# Patient Record
Sex: Male | Born: 1945
Health system: Southern US, Community
[De-identification: ages and names within clinical notes are randomized; demographics above are authoritative.]

## PROBLEM LIST (undated history)

## (undated) DIAGNOSIS — IMO0001 Reserved for inherently not codable concepts without codable children: Secondary | ICD-10-CM

## (undated) DIAGNOSIS — H5440 Blindness, one eye, unspecified eye: Secondary | ICD-10-CM

## (undated) DIAGNOSIS — R350 Frequency of micturition: Secondary | ICD-10-CM

## (undated) DIAGNOSIS — R739 Hyperglycemia, unspecified: Secondary | ICD-10-CM

## (undated) DIAGNOSIS — I200 Unstable angina: Secondary | ICD-10-CM

## (undated) DIAGNOSIS — Z973 Presence of spectacles and contact lenses: Secondary | ICD-10-CM

## (undated) DIAGNOSIS — M1990 Unspecified osteoarthritis, unspecified site: Secondary | ICD-10-CM

## (undated) DIAGNOSIS — N179 Acute kidney failure, unspecified: Secondary | ICD-10-CM

## (undated) DIAGNOSIS — I00 Rheumatic fever without heart involvement: Secondary | ICD-10-CM

## (undated) DIAGNOSIS — Z951 Presence of aortocoronary bypass graft: Secondary | ICD-10-CM

## (undated) DIAGNOSIS — S0292XA Unspecified fracture of facial bones, initial encounter for closed fracture: Secondary | ICD-10-CM

## (undated) DIAGNOSIS — I219 Acute myocardial infarction, unspecified: Secondary | ICD-10-CM

## (undated) DIAGNOSIS — M8618 Other acute osteomyelitis, other site: Secondary | ICD-10-CM

## (undated) DIAGNOSIS — J449 Chronic obstructive pulmonary disease, unspecified: Secondary | ICD-10-CM

## (undated) HISTORY — DX: Other acute osteomyelitis, other site: M86.18

## (undated) HISTORY — DX: Chronic obstructive pulmonary disease, unspecified: J44.9

## (undated) HISTORY — DX: Acute kidney failure, unspecified: N17.9

## (undated) HISTORY — DX: Hyperglycemia, unspecified: R73.9

## (undated) HISTORY — DX: Unspecified fracture of facial bones, initial encounter for closed fracture: S02.92XA

## (undated) HISTORY — DX: Presence of aortocoronary bypass graft: Z95.1

## (undated) HISTORY — PX: TONSILLECTOMY: SUR1361

## (undated) HISTORY — PX: ENUCLEATION: SHX628

---

## 2002-04-21 DIAGNOSIS — S0292XA Unspecified fracture of facial bones, initial encounter for closed fracture: Secondary | ICD-10-CM

## 2002-04-21 HISTORY — DX: Unspecified fracture of facial bones, initial encounter for closed fracture: S02.92XA

## 2002-04-21 HISTORY — PX: TRACHEOSTOMY: SUR1362

## 2003-04-14 ENCOUNTER — Inpatient Hospital Stay (HOSPITAL_COMMUNITY): Admission: AC | Admit: 2003-04-14 | Discharge: 2003-04-24 | Payer: Self-pay

## 2003-04-17 ENCOUNTER — Encounter: Payer: Self-pay | Admitting: Cardiology

## 2008-07-22 ENCOUNTER — Ambulatory Visit: Payer: Self-pay | Admitting: Radiology

## 2008-07-22 ENCOUNTER — Emergency Department (HOSPITAL_COMMUNITY): Admission: EM | Admit: 2008-07-22 | Discharge: 2008-07-22 | Payer: Self-pay | Admitting: Emergency Medicine

## 2008-07-22 ENCOUNTER — Encounter: Payer: Self-pay | Admitting: Emergency Medicine

## 2008-08-21 ENCOUNTER — Inpatient Hospital Stay (HOSPITAL_COMMUNITY): Admission: EM | Admit: 2008-08-21 | Discharge: 2008-08-22 | Payer: Self-pay | Admitting: Emergency Medicine

## 2008-08-21 ENCOUNTER — Encounter (INDEPENDENT_AMBULATORY_CARE_PROVIDER_SITE_OTHER): Payer: Self-pay | Admitting: Surgery

## 2010-04-21 HISTORY — PX: CHOLECYSTECTOMY: SHX55

## 2010-07-30 LAB — COMPREHENSIVE METABOLIC PANEL
ALT: 27 U/L (ref 0–53)
AST: 28 U/L (ref 0–37)
Albumin: 4.1 g/dL (ref 3.5–5.2)
Alkaline Phosphatase: 85 U/L (ref 39–117)
BUN: 14 mg/dL (ref 6–23)
CO2: 25 mEq/L (ref 19–32)
Calcium: 9.4 mg/dL (ref 8.4–10.5)
Chloride: 104 mEq/L (ref 96–112)
Creatinine, Ser: 0.8 mg/dL (ref 0.4–1.5)
GFR calc Af Amer: >60 mL/min (ref >60)
GFR calc non Af Amer: >60 mL/min (ref >60)
Glucose, Bld: 141 mg/dL — ABNORMAL HIGH (ref 70–99)
Potassium: 4.1 mEq/L (ref 3.5–5.1)
Sodium: 136 mEq/L (ref 135–145)
Total Bilirubin: 0.8 mg/dL (ref 0.3–1.2)
Total Protein: 6.6 g/dL (ref 6.0–8.3)

## 2010-07-30 LAB — DIFFERENTIAL
Basophils Absolute: 0 10*3/uL (ref 0.0–0.1)
Basophils Relative: 0 % (ref 0–1)
Eosinophils Absolute: 0 10*3/uL (ref 0.0–0.7)
Eosinophils Relative: 0 % (ref 0–5)
Lymphocytes Relative: 7 % — ABNORMAL LOW (ref 12–46)
Lymphs Abs: 0.8 10*3/uL (ref 0.7–4.0)
Monocytes Absolute: 0.4 10*3/uL (ref 0.1–1.0)
Monocytes Relative: 3 % (ref 3–12)
Neutro Abs: 10.5 10*3/uL — ABNORMAL HIGH (ref 1.7–7.7)
Neutrophils Relative %: 90 % — ABNORMAL HIGH (ref 43–77)

## 2010-07-30 LAB — LIPASE, BLOOD: Lipase: 18 U/L (ref 11–59)

## 2010-07-30 LAB — CBC
HCT: 53.8 % — ABNORMAL HIGH (ref 39.0–52.0)
Hemoglobin: 18.5 g/dL — ABNORMAL HIGH (ref 13.0–17.0)
MCHC: 34.4 g/dL (ref 30.0–36.0)
MCV: 91.8 fL (ref 78.0–100.0)
Platelets: 152 10*3/uL (ref 150–400)
RBC: 5.86 MIL/uL — ABNORMAL HIGH (ref 4.22–5.81)
RDW: 13.5 % (ref 11.5–15.5)
WBC: 11.7 10*3/uL — ABNORMAL HIGH (ref 4.0–10.5)

## 2010-07-31 LAB — DIFFERENTIAL
Lymphocytes Relative: 10 % — ABNORMAL LOW (ref 12–46)
Lymphs Abs: 1.2 10*3/uL (ref 0.7–4.0)
Monocytes Relative: 7 % (ref 3–12)
Neutro Abs: 9.8 10*3/uL — ABNORMAL HIGH (ref 1.7–7.7)
Neutrophils Relative %: 79 % — ABNORMAL HIGH (ref 43–77)

## 2010-07-31 LAB — COMPREHENSIVE METABOLIC PANEL
BUN: 10 mg/dL (ref 6–23)
Calcium: 9.3 mg/dL (ref 8.4–10.5)
Glucose, Bld: 145 mg/dL — ABNORMAL HIGH (ref 70–99)
Sodium: 141 mEq/L (ref 135–145)
Total Protein: 7.9 g/dL (ref 6.0–8.3)

## 2010-07-31 LAB — CBC
HCT: 51.7 % (ref 39.0–52.0)
Hemoglobin: 17.4 g/dL — ABNORMAL HIGH (ref 13.0–17.0)
MCHC: 33.6 g/dL (ref 30.0–36.0)
RDW: 12.4 % (ref 11.5–15.5)

## 2010-09-03 NOTE — Op Note (Signed)
Sean Bridges, Sean Bridges NO.:  [id number]   MEDICAL RECORD NO.:  [id number]          PATIENT TYPE:  INP   LOCATION:  0098                         FACILITY:  [facility]   PHYSICIAN:  Velora Heckler, MD      DATE OF BIRTH:  01-23-1946   DATE OF PROCEDURE:  08/21/2008  DATE OF DISCHARGE:                               OPERATIVE REPORT   PREOPERATIVE DIAGNOSIS:  Acute cholecystitis.   POSTOPERATIVE DIAGNOSIS:  Acute cholecystitis.   PROCEDURE:  Laparoscopic cholecystectomy.   SURGEON:  Velora Heckler, M.D., FACS   ASSISTANT:  Cyndia Bent, M.D., FACS   ANESTHESIA:  General per Dr. Clydell Hakim.   ESTIMATED BLOOD LOSS:  Minimal.   PREPARATION:  Betadine.   COMPLICATIONS:  None.   INDICATIONS:  The patient is a 65 year old white male who presents to  the emergency department with 12-hour history of right upper quadrant  abdominal pain associated with nausea and vomiting.  The patient had a  similar episode 1 month prior which brought him to the emergency  department.  Laboratory studies showed a white count of 11.7 with 90%  segmented neutrophils.  Ultrasound demonstrated a thick-walled  gallbladder with pericholecystic fluid and a sonographic Murphy sign,  all consistent with acute cholecystitis.  The patient is now brought to  the operating room for cholecystectomy.   BODY OF REPORT:  The procedure is done in OR #6 at the [facility].  The patient is brought to the operating room and  placed in the supine position on the operating room table.  Following  administration of general anesthesia, the patient is positioned and then  prepped and draped in the usual strict aseptic fashion.  After  ascertaining that an adequate level of anesthesia had been achieved, an  infraumbilical incision is made with a #15 blade.  Dissection is carried  down to the fascia.  The fascia is incised in the midline and the  peritoneal cavity is entered cautiously.   A 0-Vicryl pursestring suture  is placed in the fascia.  An Hasson cannula is introduced and secured  with a pursestring suture.  The abdomen is insufflated with carbon  dioxide. The laparoscope is introduced and the abdomen explored.  Operative ports are placed along the right costal margin in the midline,  midclavicular line and anterior axillary line.  Omentum is stripped off  of the surface of the gallbladder.  The gallbladder is markedly  distended, acutely inflamed and somewhat ischemic looking.  Using an  aspirating trocar, the gallbladder is evacuated of dark black bile.  The  gallbladder is then grasped and retracted cephalad and the omental  adhesions are taken down.  Dissection is carried down to the neck of the  gallbladder where there is a large lymph node present.  This is  carefully dissected away.  The cystic duct is identified and dissected  out.  It is relatively foreshortened.  Tissues are very inflamed and  friable.  Decision is made not to proceed with cholangiography and the  cystic duct is triply clipped and divided.  The cystic artery is  dissected out, doubly clipped, and divided.  The posterior branch of the  cystic artery is doubly clipped and divided.  The gallbladder is then  excised from the gallbladder bed using the hook electrocautery for  hemostasis.  Due to inflammation, it is difficult at some points to  determine the plane between the gallbladder and the liver.  However, the  gallbladder is successfully excised and hemostasis is obtained with the  electrocautery.  The gallbladder is placed into an EndoCatch bag and  withdrawn through the umbilical port with some difficulty.  The  gallbladder wall is markedly thickened.  There are no palpable stones  and no visible stones.  The right upper quadrant is copiously irrigated  with warm saline and good hemostasis is obtained in the gallbladder bed  with electrocautery.  Surgicel is placed in the gallbladder  bed.  Fluid  is evacuated.  Pneumoperitoneum is released and ports are removed under  direct vision.  Good hemostasis is noted at all port sites.  Pneumoperitoneum is completely released.  Zero Vicryl pursestring  sutures are tied securely.  Port sites are anesthetized with local  anesthetic.  Wounds are closed with interrupted 4-0 Vicryl subcuticular  sutures.  Wounds are washed and dried and Benzoin and Steri-Strips are  applied.  Sterile dressings are applied.  The patient is awakened from  anesthesia and brought to the recovery room.  The patient tolerated the  procedure well.      Velora Heckler, MD  Electronically Signed     TMG/MEDQ  D:  08/21/2008  T:  08/21/2008  Job:  [id number]   cc:   Stacie Acres. Cliffton Asters, M.D.  Fax: [phone]

## 2010-09-03 NOTE — Consult Note (Signed)
NAME:  Sean Bridges, Sean Bridges NO.:  [id number]   MEDICAL RECORD NO.:  [id number]          PATIENT TYPE:  EMS   LOCATION:  ED                           FACILITY:  [facility]   PHYSICIAN:  Thornton Park. Daphine Deutscher, MD  DATE OF BIRTH:  14-Mar-1946   DATE OF CONSULTATION:  07/22/2008  DATE OF DISCHARGE:  07/22/2008                                 CONSULTATION   CHIEF COMPLAINT:  Cholecystitis, 2 days' duration.   HISTORY:  This is a 66 year old male who was seen at the [facility] and apparently sent here with a chief complaint of abdominal pain  over the last 2 days.  He actually had some pain here about 2 weeks ago  but these symptoms resolved.  The patient said he had some peripherally  persistent right upper quadrant pain although he said it recently eased  off.   I was called in to see the patient about surgical management and, when I  approached him, he said that he wanted know about nonsurgical management  and was interested in that at this time.  I went ahead and let him have  antibiotics and something for pain and to stay on a low-fat diet.  If  this then resolved, then he could see [place] in the office.   PAST MEDICAL HISTORY:  He has had no prior abdominal surgery.   No drug abuse.  He is a smoker.  Occasional drinker.   He has no known drug allergies.   He takes no medications.   The patient had laboratory showing a hemoglobin of 17.4, white count  12,500.  Lipase was normal.  BUN 24 is within normal limits totally.  Gallbladder ultrasound was consistent with cholecystitis.   After discussing options with him, the patient wants to be treated  medically.  We will send him home on some Augmentin 500 mg b.i.d. and  some Vicodin for pain.  He will follow up in the office if needed.      Thornton Park Daphine Deutscher, MD  Electronically Signed     MBM/MEDQ  D:  07/22/2008  T:  07/22/2008  Job:  [id number]
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
## 2013-06-22 ENCOUNTER — Other Ambulatory Visit: Payer: Self-pay | Admitting: Internal Medicine

## 2013-06-22 DIAGNOSIS — R319 Hematuria, unspecified: Secondary | ICD-10-CM

## 2013-06-24 ENCOUNTER — Ambulatory Visit
Admission: RE | Admit: 2013-06-24 | Discharge: 2013-06-24 | Disposition: A | Discharge status: Home / Self Care | Payer: Medicare Other | Source: Ambulatory Visit | Attending: Internal Medicine | Admitting: Internal Medicine

## 2013-06-24 DIAGNOSIS — R319 Hematuria, unspecified: Secondary | ICD-10-CM

## 2013-08-16 ENCOUNTER — Other Ambulatory Visit: Payer: Self-pay | Admitting: Urology

## 2013-10-06 ENCOUNTER — Encounter (HOSPITAL_BASED_OUTPATIENT_CLINIC_OR_DEPARTMENT_OTHER): Payer: Self-pay | Admitting: *Deleted

## 2013-10-06 NOTE — Progress Notes (Signed)
To [facility] at 0700-Hg on arrival-instructed NPO after Mn- also to refrain from smoking.

## 2013-10-12 ENCOUNTER — Ambulatory Visit (HOSPITAL_BASED_OUTPATIENT_CLINIC_OR_DEPARTMENT_OTHER): Payer: Medicare Other | Admitting: Anesthesiology

## 2013-10-12 ENCOUNTER — Encounter (HOSPITAL_BASED_OUTPATIENT_CLINIC_OR_DEPARTMENT_OTHER): Payer: Self-pay | Admitting: *Deleted

## 2013-10-12 ENCOUNTER — Encounter (HOSPITAL_BASED_OUTPATIENT_CLINIC_OR_DEPARTMENT_OTHER): Payer: Medicare Other | Admitting: Anesthesiology

## 2013-10-12 ENCOUNTER — Ambulatory Visit (HOSPITAL_BASED_OUTPATIENT_CLINIC_OR_DEPARTMENT_OTHER)
Admission: RE | Admit: 2013-10-12 | Discharge: 2013-10-12 | Disposition: A | Discharge status: Home / Self Care | Payer: Medicare Other | Source: Ambulatory Visit | Attending: Urology | Admitting: Urology

## 2013-10-12 ENCOUNTER — Encounter (HOSPITAL_BASED_OUTPATIENT_CLINIC_OR_DEPARTMENT_OTHER)
Admission: RE | Disposition: A | Discharge status: Home / Self Care | Payer: Self-pay | Source: Ambulatory Visit | Attending: Urology

## 2013-10-12 DIAGNOSIS — R3129 Other microscopic hematuria: Secondary | ICD-10-CM | POA: Insufficient documentation

## 2013-10-12 DIAGNOSIS — F17200 Nicotine dependence, unspecified, uncomplicated: Secondary | ICD-10-CM | POA: Insufficient documentation

## 2013-10-12 DIAGNOSIS — N478 Other disorders of prepuce: Secondary | ICD-10-CM | POA: Insufficient documentation

## 2013-10-12 DIAGNOSIS — N400 Enlarged prostate without lower urinary tract symptoms: Secondary | ICD-10-CM | POA: Insufficient documentation

## 2013-10-12 DIAGNOSIS — N471 Phimosis: Secondary | ICD-10-CM | POA: Insufficient documentation

## 2013-10-12 HISTORY — PX: CIRCUMCISION: SHX1350

## 2013-10-12 HISTORY — PX: CYSTOSCOPY: SHX5120

## 2013-10-12 HISTORY — DX: Rheumatic fever without heart involvement: I00

## 2013-10-12 LAB — POCT I-STAT, CHEM 8
BUN: 16 mg/dL (ref 6–23)
CALCIUM ION: 1.27 mmol/L (ref 1.13–1.30)
CHLORIDE: 103 meq/L (ref 96–112)
CREATININE: 0.9 mg/dL (ref 0.50–1.35)
GLUCOSE: 119 mg/dL — AB (ref 70–99)
HEMATOCRIT: 57 % — AB (ref 39.0–52.0)
Hemoglobin: 19.4 g/dL — ABNORMAL HIGH (ref 13.0–17.0)
Potassium: 4 mEq/L (ref 3.7–5.3)
Sodium: 143 mEq/L (ref 137–147)
TCO2: 24 mmol/L (ref 0–100)

## 2013-10-12 SURGERY — CIRCUMCISION, ADULT
Anesthesia: General | Site: Ureter

## 2013-10-12 MED ORDER — LACTATED RINGERS IV SOLN
INTRAVENOUS | Status: DC
  Administered 2013-10-12: 08:00:00 via INTRAVENOUS
  Filled 2013-10-12: qty 1000

## 2013-10-12 MED ORDER — PROPOFOL 10 MG/ML IV BOLUS
INTRAVENOUS | Status: DC | PRN
  Administered 2013-10-12: 180 mg via INTRAVENOUS

## 2013-10-12 MED ORDER — FENTANYL CITRATE 0.05 MG/ML IJ SOLN
INTRAMUSCULAR | Status: DC | PRN
  Administered 2013-10-12 (×2): 25 ug via INTRAVENOUS
  Administered 2013-10-12: 50 ug via INTRAVENOUS
  Administered 2013-10-12: 25 ug via INTRAVENOUS

## 2013-10-12 MED ORDER — DEXAMETHASONE SODIUM PHOSPHATE 4 MG/ML IJ SOLN
INTRAMUSCULAR | Status: DC | PRN
  Administered 2013-10-12: 10 mg via INTRAVENOUS

## 2013-10-12 MED ORDER — FENTANYL CITRATE 0.05 MG/ML IJ SOLN
INTRAMUSCULAR | Status: AC
  Filled 2013-10-12: qty 6

## 2013-10-12 MED ORDER — CLINDAMYCIN PHOSPHATE 900 MG/50ML IV SOLN
INTRAVENOUS | Status: AC
  Filled 2013-10-12: qty 50

## 2013-10-12 MED ORDER — OXYCODONE-ACETAMINOPHEN 5-325 MG PO TABS: 1.0000 | ORAL_TABLET | Freq: Four times a day (QID) | ORAL | Status: DC | PRN

## 2013-10-12 MED ORDER — CLINDAMYCIN PHOSPHATE 900 MG/50ML IV SOLN
900.0000 mg | Freq: Once | INTRAVENOUS | Status: AC
  Administered 2013-10-12: 900 mg via INTRAVENOUS
  Filled 2013-10-12: qty 50

## 2013-10-12 MED ORDER — MIDAZOLAM HCL 5 MG/5ML IJ SOLN
INTRAMUSCULAR | Status: DC | PRN
  Administered 2013-10-12: 2 mg via INTRAVENOUS

## 2013-10-12 MED ORDER — GENTAMICIN IN SALINE 1.6-0.9 MG/ML-% IV SOLN
80.0000 mg | INTRAVENOUS | Status: AC
  Administered 2013-10-12: 390 mg via INTRAVENOUS
  Filled 2013-10-12: qty 50

## 2013-10-12 MED ORDER — STERILE WATER FOR IRRIGATION IR SOLN
Status: DC | PRN
  Administered 2013-10-12: 3000 mL

## 2013-10-12 MED ORDER — EPHEDRINE SULFATE 50 MG/ML IJ SOLN
INTRAMUSCULAR | Status: DC | PRN
  Administered 2013-10-12: 10 mg via INTRAVENOUS
  Administered 2013-10-12: 5 mg via INTRAVENOUS

## 2013-10-12 MED ORDER — LIDOCAINE HCL (CARDIAC) 20 MG/ML IV SOLN
INTRAVENOUS | Status: DC | PRN
Start: 2013-10-12 — End: 2013-10-12
  Administered 2013-10-12: 60 mg via INTRAVENOUS

## 2013-10-12 MED ORDER — ONDANSETRON HCL 4 MG/2ML IJ SOLN
INTRAMUSCULAR | Status: DC | PRN
  Administered 2013-10-12: 4 mg via INTRAVENOUS

## 2013-10-12 MED ORDER — MIDAZOLAM HCL 2 MG/2ML IJ SOLN
INTRAMUSCULAR | Status: AC
  Filled 2013-10-12: qty 2

## 2013-10-12 MED ORDER — SENNOSIDES-DOCUSATE SODIUM 8.6-50 MG PO TABS: 1.0000 | ORAL_TABLET | Freq: Two times a day (BID) | ORAL | Status: DC

## 2013-10-12 MED ORDER — FENTANYL CITRATE 0.05 MG/ML IJ SOLN
25.0000 ug | INTRAMUSCULAR | Status: DC | PRN
  Filled 2013-10-12: qty 1

## 2013-10-12 MED ORDER — PROMETHAZINE HCL 25 MG/ML IJ SOLN
6.2500 mg | INTRAMUSCULAR | Status: DC | PRN
  Filled 2013-10-12: qty 1

## 2013-10-12 MED ORDER — MEPERIDINE HCL 25 MG/ML IJ SOLN
6.2500 mg | INTRAMUSCULAR | Status: DC | PRN
  Filled 2013-10-12: qty 1

## 2013-10-12 SURGICAL SUPPLY — 45 items
ADH SKN CLS APL DERMABOND .7 (GAUZE/BANDAGES/DRESSINGS) ×2
BAG URO CATCHER STRL LF (DRAPE) ×3 IMPLANT
BANDAGE COBAN STERILE 2 (GAUZE/BANDAGES/DRESSINGS) IMPLANT
BLADE SURG 15 STRL LF DISP TIS (BLADE) ×2 IMPLANT
BLADE SURG 15 STRL SS (BLADE) ×2
BNDG CONFORM 2 STRL LF (GAUZE/BANDAGES/DRESSINGS) IMPLANT
CATH ROBINSON RED A/P 14FR (CATHETERS) ×3 IMPLANT
CLOTH BEACON ORANGE TIMEOUT ST (SAFETY) ×3 IMPLANT
COVER LIGHT HANDLE  1/PK (MISCELLANEOUS) ×1
COVER LIGHT HANDLE 1/PK (MISCELLANEOUS) ×2 IMPLANT
COVER MAYO STAND STRL (DRAPES) IMPLANT
COVER TABLE BACK 60X90 (DRAPES) ×3 IMPLANT
DECANTER SPIKE VIAL GLASS SM (MISCELLANEOUS) IMPLANT
DERMABOND ADVANCED (GAUZE/BANDAGES/DRESSINGS) ×1
DERMABOND ADVANCED .7 DNX12 (GAUZE/BANDAGES/DRESSINGS) ×2 IMPLANT
DRAPE CAMERA CLOSED 9X96 (DRAPES) ×3 IMPLANT
DRAPE LAPAROTOMY T 102X78X121 (DRAPES) IMPLANT
DRAPE PED LAPAROTOMY (DRAPES) IMPLANT
ELECT NEEDLE TIP 2.8 STRL (NEEDLE) IMPLANT
ELECT REM PT RETURN 9FT ADLT (ELECTROSURGICAL) ×3
ELECTRODE REM PT RTRN 9FT ADLT (ELECTROSURGICAL) ×2 IMPLANT
GAUZE SPONGE 4X4 16PLY XRAY LF (GAUZE/BANDAGES/DRESSINGS) ×3 IMPLANT
GAUZE XEROFORM 1X8 LF (GAUZE/BANDAGES/DRESSINGS) IMPLANT
GLOVE BIO SURGEON STRL SZ 6.5 (GLOVE) ×3 IMPLANT
GLOVE BIO SURGEON STRL SZ7.5 (GLOVE) ×3 IMPLANT
GLOVE BIOGEL PI IND STRL 6.5 (GLOVE) ×2 IMPLANT
GLOVE BIOGEL PI INDICATOR 6.5 (GLOVE) ×1
GOWN PREVENTION PLUS LG XLONG (DISPOSABLE) IMPLANT
GOWN STRL NON-REIN LRG LVL3 (GOWN DISPOSABLE) IMPLANT
GOWN STRL REIN XL XLG (GOWN DISPOSABLE) IMPLANT
GOWN STRL REUS W/TWL LRG LVL3 (GOWN DISPOSABLE) ×3 IMPLANT
GOWN STRL REUS W/TWL XL LVL3 (GOWN DISPOSABLE) ×3 IMPLANT
NEEDLE HYPO 25X1 1.5 SAFETY (NEEDLE) IMPLANT
NS IRRIG 500ML POUR BTL (IV SOLUTION) IMPLANT
PACK BASIN DAY SURGERY FS (CUSTOM PROCEDURE TRAY) IMPLANT
PACK CYSTOSCOPY (CUSTOM PROCEDURE TRAY) ×3 IMPLANT
PENCIL BUTTON HOLSTER BLD 10FT (ELECTRODE) ×3 IMPLANT
SPONGE GAUZE 4X4 12PLY (GAUZE/BANDAGES/DRESSINGS) ×3 IMPLANT
SUT VIC AB 3-0 SH 27 (SUTURE) ×2
SUT VIC AB 3-0 SH 27X BRD (SUTURE) ×2 IMPLANT
SYR CONTROL 10ML LL (SYRINGE) IMPLANT
TOWEL OR 17X26 10 PK STRL BLUE (TOWEL DISPOSABLE) ×3 IMPLANT
TRAY DSU PREP LF (CUSTOM PROCEDURE TRAY) ×3 IMPLANT
WATER STERILE IRR 3000ML UROMA (IV SOLUTION) ×3 IMPLANT
WATER STERILE IRR 500ML POUR (IV SOLUTION) IMPLANT

## 2013-10-12 NOTE — Anesthesia Postprocedure Evaluation (Signed)
  Anesthesia Post-op Note  Patient: Sean Bridges  Procedure(s) Performed: Procedure(s) (LRB): DORSAL SLIT (N/A) CYSTOSCOPY (N/A)  Patient Location: PACU  Anesthesia Type: General  Level of Consciousness: awake and alert   Airway and Oxygen Therapy: Patient Spontanous Breathing  Post-op Pain: mild  Post-op Assessment: Post-op Vital signs reviewed, Patient's Cardiovascular Status Stable, Respiratory Function Stable, Patent Airway and No signs of Nausea or vomiting  Last Vitals:  Filed Vitals:   10/12/13 0930  BP: 125/79  Pulse: 77  Temp:   Resp: 13    Post-op Vital Signs: stable   Complications: No apparent anesthesia complications

## 2013-10-12 NOTE — Anesthesia Procedure Notes (Signed)
Procedure Name: LMA Insertion Date/Time: 10/12/2013 8:28 AM Performed by: Denna Haggard D Pre-anesthesia Checklist: Patient identified, Emergency Drugs available, Suction available and Patient being monitored Patient Re-evaluated:Patient Re-evaluated prior to inductionOxygen Delivery Method: Circle System Utilized Preoxygenation: Pre-oxygenation with 100% oxygen Intubation Type: IV induction Ventilation: Mask ventilation without difficulty LMA: LMA inserted LMA Size: 4.0 Number of attempts: 1 Airway Equipment and Method: bite block Placement Confirmation: positive ETCO2 Tube secured with: Tape Dental Injury: Teeth and Oropharynx as per pre-operative assessment

## 2013-10-12 NOTE — Anesthesia Preprocedure Evaluation (Addendum)
Anesthesia Evaluation  Patient identified by MRN, date of birth, ID band Patient awake    Reviewed: Allergy & Precautions, H&P , NPO status , Patient's Chart, lab work & pertinent test results  Airway Mallampati: II TM Distance: >3 FB Neck ROM: Full    Dental no notable dental hx.    Pulmonary Current Smoker,  breath sounds clear to auscultation  Pulmonary exam normal       Cardiovascular negative cardio ROS  Rhythm:Regular Rate:Normal     Neuro/Psych negative neurological ROS  negative psych ROS   GI/Hepatic negative GI ROS, Neg liver ROS,   Endo/Other  negative endocrine ROS  Renal/GU negative Renal ROS  negative genitourinary   Musculoskeletal negative musculoskeletal ROS (+)   Abdominal   Peds negative pediatric ROS (+)  Hematology negative hematology ROS (+)   Anesthesia Other Findings   Reproductive/Obstetrics negative OB ROS                          Anesthesia Physical Anesthesia Plan  ASA: II  Anesthesia Plan: General   Post-op Pain Management:    Induction: Intravenous  Airway Management Planned: LMA  Additional Equipment:   Intra-op Plan:   Post-operative Plan:   Informed Consent: I have reviewed the patients History and Physical, chart, labs and discussed the procedure including the risks, benefits and alternatives for the proposed anesthesia with the patient or authorized representative who has indicated his/her understanding and acceptance.   Dental advisory given  Plan Discussed with: CRNA  Anesthesia Plan Comments:         Anesthesia Quick Evaluation

## 2013-10-12 NOTE — Op Note (Signed)
NAME:  Sean Bridges, Sean Bridges NO.:  [id number]  MEDICAL RECORD NO.:  [id number]  LOCATION:                                 FACILITY:  PHYSICIAN:  Alexis Frock, MD     DATE OF BIRTH:  01-Aug-1945  DATE OF PROCEDURE:  10/12/2013 DATE OF DISCHARGE:                              OPERATIVE REPORT   PREOPERATIVE DIAGNOSES:  Phimosis and microscopic hematuria.  PROCEDURE: 1. Foreskin dorsal slit. 2. Cystoscopy.  ESTIMATED BLOOD LOSS:  Nil.  COMPLICATIONS:  None.  SPECIMEN:  None.  FINDINGS: 1. Dense distal phimotic ring, this was incised approximately 2 cm     such that the urethral meatus and glans could be visualized but no     further as per patient's wishes. 2. Large bilobar prostatic hypertrophy with some prostatic varicosity. 3. Otherwise, unremarkable urinary bladder.  INDICATION:  Sean Bridges is a pleasant 68 year old gentleman with recent history of microhematuria.  He underwent evaluation with CT urogram, which was unremarkable.  He underwent trial of office cystoscopy; however, this was unsuccessful ashe had incredibly dense phimosis of his redundant foreskin which precluded identificaiton of his urethral meatus.  Options were discussed including operative dorsal slit versus circumcision and cystoscopy to complete the evaluation and he wished to proceed with dorsal slit and cysto.  Informed consent was obtained and placed in medical record.  PROCEDURE IN DETAIL:  The patient being Sean Bridges was verified, procedure being cystoscopy and dorsal slit was confirmed.    Time-out was performed.  Intravenous antibiotics were administered.  General LMA anesthesia was introduced.  Patient placed into a low lithotomy position.  Sterile field was created by prepping the pateint's penis, perineum, proximal thighs using iodine x3.  Next, penis was examined and there was an incredibly dense phimotic ring, only approximately 12-French in diameter with no ability to  visualize the glans penis proper.  The 12 o'clock position of the foreskin was marked using a sterile pen and a straight cold clamp was applied to this for approximately 2 cm, held for 90 seconds, and then using cold knife, this was incised thus performing dorsal slit.  This allowed delivery on the distal glans and visualization of urethral meatus.  As expected, there were multiple dense adhesions throughout the glans to the inner prepuce, however, the goal was not to release these, but only allow visualization of the glans.  Cystoscopy could be performed via this therefore further slitting and adhesion release was purposely not performed. Additional hemostasis was achieved with point coagulation current. Next, flexible cystoscopy was performed using a 16-French flexible cystoscope.  Flexible cystoscopy was unremarkable.  The anterior urethra, posterior urethra inspection revealed large bilobar prostatic hypertrophy with large intravesical component.  There were some varicosities of this. Inspection of the urinary bladder revealed no diverticula, calcifications, papular lesions.  Ureteral orifices were in the normal anatomic position.  Scope was retroflexed and no additional findings were found.  The scope was removed and a 14-French red rubber was placed in urinary bladder to drain it.  Next, a 12 o'clock matress suture was placed at the apex of the dorsal slit and 2 separate running suture lines of undyed  3-0 Vicryl were used to reapproximate the skin edges, followed by Dermabond and triple antibiotic ointment.  Following these maneuvers, the meatus was clearly visible.  However, there had been no prepuce actually excised as per the goals.  The procedure was terminated.  The patient tolerated procedure well.  There were no immediate periprocedural complications.  The patient was taken to postanesthesia care unit in stable condition.          ______________________________ Alexis Frock, MD     TM/MEDQ  D:  10/12/2013  T:  10/12/2013  Job:  [id number]

## 2013-10-12 NOTE — Discharge Instructions (Signed)
1 - You have dissolvable stitches that will unravel / dissapear in about 2 weeks. It is normal to have significant sensitivity of the head of the penis as well.   2 - Call MD or go to ER for fever >102, severe pain / nausea / vomiting not relieved by medications, or acute change in medical status.  3 - You may continue to apply neosporin or similar antibiotic ointment to the area of incision until all stitches have resolved.

## 2013-10-12 NOTE — Brief Op Note (Signed)
10/12/2013  9:04 AM  PATIENT:  Sean Bridges  68 y.o. male  PRE-OPERATIVE DIAGNOSIS:  PHIMOSIS, HEMATURIA  POST-OPERATIVE DIAGNOSIS:  PHIMOSIS, HEMATURIA  PROCEDURE:  Procedure(s): DORSAL SLIT (N/A) CYSTOSCOPY (N/A)  SURGEON:  Surgeon(s) and Role:    * Alexis Frock, MD - Primary  PHYSICIAN ASSISTANT:   ASSISTANTS: none   ANESTHESIA:   general  EBL:  Total I/O In: 200 [I.V.:200] Out: -   BLOOD ADMINISTERED:none  DRAINS: none   LOCAL MEDICATIONS USED:  NONE  SPECIMEN:  No Specimen  DISPOSITION OF SPECIMEN:  N/A  COUNTS:  NO (yes)  TOURNIQUET:  * No tourniquets in log *  DICTATION: .Other Dictation: Dictation Number [id number]  PLAN OF CARE: Discharge to home after PACU  PATIENT DISPOSITION:  PACU - hemodynamically stable.   Delay start of Pharmacological VTE agent (>24hrs) due to surgical blood loss or risk of bleeding: not applicable

## 2013-10-12 NOTE — Transfer of Care (Signed)
Immediate Anesthesia Transfer of Care Note  Patient: Sean Bridges  Procedure(s) Performed: Procedure(s) (LRB): DORSAL SLIT (N/A) CYSTOSCOPY (N/A)  Patient Location: PACU  Anesthesia Type: General  Level of Consciousness: awake, oriented, sedated and patient cooperative  Airway & Oxygen Therapy: Patient Spontanous Breathing and Patient connected to face mask oxygen  Post-op Assessment: Report given to PACU RN and Post -op Vital signs reviewed and stable  Post vital signs: Reviewed and stable  Complications: No apparent anesthesia complications

## 2013-10-12 NOTE — H&P (Signed)
Sean Bridges is an 68 y.o. male.    Chief Complaint: Pre-Op Foreskin Dorsal Slit + Cystoscopy  HPI:   1 - Rt Upper Pole Renal Cyst - 1.8cm rt upper mass by [place] 06/2013 on eval microhematuria and erythrocytosis by PCP.Dedicated CT 07/2013 with Bosniak 2 86mm Rt upper lateral cyst.   2 - Microscopic Hematuria - >5RBC /HPF noted on UA at PCP. >10PY smoker, still smokes. CT Urogram 07/2013 with small rt renal cyst. Cysto 07/2013 unable to visulize proximal to glans due to severe phimosis, offered operative cysto / foreskin procedure and now wants to proceed.    3 - Prostate Cyst - Incidental 52mm prostate cyst neat anterior bladder base by [place] 06/3023. No baseline LUTS, h/o prostatitis.   4 - Prostate Cancer Screening - No FHX prostate cancer. 06/2013 - DRE 40gm, rt base slight induration / PSA 3.3  5 - Phimosis - never circumcised. Impressive phimosis, unable to retract foreskin, currently only modest bother. Unable to perform complete office cysto. He wants dorsal slit done to allow cysto and better hygeine.  PMH sig for erythrocytosis, chole, borderline DM  Today Sean Bridges is seen  To proceed with elective dorsal slit for his severe phimosis and cystoscopy to complete hematuria evaluaiton.   Past Medical History  Diagnosis Date  . Rheumatic fever     told had during childhood    Past Surgical History  Procedure Laterality Date  . Cholecystectomy  2012    History reviewed. No pertinent family history. Social History:  reports that he has been smoking.  He does not have any smokeless tobacco history on file. His alcohol and drug histories are not on file.  Allergies: No Known Allergies  No prescriptions prior to admission    No results found for this or any previous visit (from the past 48 hour(s)). No results found.  Review of Systems  Constitutional: Negative.  Negative for fever and chills.  HENT: Negative.   Eyes: Negative.   Respiratory: Negative.   Cardiovascular: Negative.    Gastrointestinal: Negative.   Genitourinary: Negative.   Musculoskeletal: Negative.   Skin: Negative.   Neurological: Negative.   Endo/Heme/Allergies: Negative.   Psychiatric/Behavioral: Negative.     Height 5' 8.5" (1.74 m), weight 79.379 kg (175 lb). Physical Exam  Constitutional: He is oriented to person, place, and time. He appears well-developed and well-nourished.  HENT:  Head: Normocephalic and atraumatic.  Eyes: EOM are normal. Pupils are equal, round, and reactive to light.  Neck: Normal range of motion. Neck supple.  Cardiovascular: Normal rate.   Respiratory: Effort normal and breath sounds normal.  GI: Soft. Bowel sounds are normal.  Genitourinary:  Severe phimosis  Musculoskeletal: Normal range of motion.  Neurological: He is alert and oriented to person, place, and time.  Skin: Skin is warm and dry.  Psychiatric: He has a normal mood and affect. His behavior is normal. Judgment and thought content normal.     Assessment/Plan  1 - Rt Upper Pole Renal Cyst - Dedicated CT c/w minimally complex cyst, recommend surveillance at this point and discussed DDX and alternatives.  Low risk of malignancy.  2 - Microscopic Hematuria - Eval with imaging, exam, labs, unremarkable, but still needs cysto which canNOT be done safely in office with his anatomy. I recommended operative cysto and circumcision and foreskin procedure and he wants to proceed.   Risks including bleeding, infection, poor cosmesis, recurrence of phimosis as well as rare risks such as DVT, PE, Mortality discussed.  3 - Prostate Cyst - asymptotic, not c/w prostate cancer by location and appearance, observe.  4 - Prostate Cancer Screening - up to date this year.  5 - Phimosis - again offered circumcision v. dilation v. dorsal slit v. observation and he wants to proceed with dorsal slit as per above. This is completely reasonable.  MANNY, THEODORE 10/12/2013, 6:19 AM

## 2013-10-13 ENCOUNTER — Encounter (HOSPITAL_BASED_OUTPATIENT_CLINIC_OR_DEPARTMENT_OTHER): Payer: Self-pay | Admitting: Urology

## 2015-02-26 ENCOUNTER — Other Ambulatory Visit: Payer: Self-pay | Admitting: Cardiology

## 2015-02-26 ENCOUNTER — Ambulatory Visit
Admission: RE | Admit: 2015-02-26 | Discharge: 2015-02-26 | Disposition: A | Discharge status: Home / Self Care | Payer: Medicare Other | Source: Ambulatory Visit | Attending: Cardiology | Admitting: Cardiology

## 2015-02-26 ENCOUNTER — Encounter: Payer: Self-pay | Admitting: Cardiology

## 2015-02-26 DIAGNOSIS — R079 Chest pain, unspecified: Secondary | ICD-10-CM

## 2015-02-26 DIAGNOSIS — R0789 Other chest pain: Secondary | ICD-10-CM

## 2015-02-26 DIAGNOSIS — R9430 Abnormal result of cardiovascular function study, unspecified: Secondary | ICD-10-CM

## 2015-02-26 NOTE — H&P (Signed)
Sean Bridges    Date of visit:  02/26/2015 DOB:  [birth year]    Age:  69 yrs. Medical record number:  [id number]     Account number:  [id number] Primary Care Provider: [name] ____________________________ CURRENT DIAGNOSES  1. Chest pain  2. Abnormal result of cardiovascular function study, unspecified ____________________________ ALLERGIES  No Known Allergies ____________________________ MEDICATIONS  1. aspirin 325 mg tablet, 1 p.o. daily  2. Flonase Allergy Relief 50 mcg/actuation nasal spray,suspension, qd ____________________________ HISTORY OF PRESENT ILLNESS This 69 year old male is seen for evaluation of chest pain and for catheterization evaluation. The patient recently saw his primary physician with a complaint of left-sided chest pain that was described as a sharp pain lasting around 5-8 seconds and occurring in his left chest area. She did not have anterior chest pressure. He had an abnormal EKG with some inferior T-wave and urgings and was sent for treadmill testing. He went 8 minutes and 30 seconds and had 2 mm of ST depression laterally with no chest pain. He had slight ST elevation in lead 3 and had Q waves suggestive of a prior inferior infarction. His risk factors include smoking. He has no history of hypertension or diabetes. He denies PND, orthopnea, syncope, palpitations, or claudication. ____________________________ PAST HISTORY  Past Medical Illnesses:  no history of hypertension, diabetes, or hyperlipidemia;  Cardiovascular Illnesses:  no previous history of cardiac disease;  Surgical Procedures:  cholecystectomy, tonsillectomy, removal l eye;  Cardiology Procedures-Invasive:  no previous interventional or invasive cardiology procedures;  Cardiology Procedures-Noninvasive:  treadmill;  LVEF not documented,   ____________________________ CARDIO-PULMONARY TEST DATES EKG Date:  02/26/2015;  Chest Xray Date: 02/26/2015;   ____________________________ FAMILY  HISTORY Father -- Father dead, Congestive heart failure Mother -- Diabetes mellitus, CVA, Mother dead ____________________________ SOCIAL HISTORY Alcohol Use:  twice q week;  Smoking:  smokes 1 ppd, 10 pack year history;  Lifestyle:  divorced;  Exercise:  some exercise;  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  Integumentary:no rashes or new skin lesions. Eyes: left eye absent from trauma Ears, Nose, Throat, Mouth:  denies any hearing loss, epistaxis, hoarseness or difficulty speaking. Respiratory: some worsening dyspnea on exertion Cardiovascular:  please review HPI Abdominal: denies dyspepsia, GI bleeding, constipation, or diarrhea Genitourinary-Male: frequency, nocturia  Musculoskeletal:  denies arthritis, venous insufficiency, or muscle weakness Neurological:  denies headaches, stroke, or TIA Psychiatric:  denies depression or anxiety  ____________________________ PHYSICAL EXAMINATION VITAL SIGNS  Blood Pressure:  90/60 Sitting, Left arm, regular cuff   Pulse:  68/min. Weight:  159.00 lbs. Height:  69"BMI: 23  Constitutional:  pleasant white male in no acute distress Skin:  warm and dry to touch, no apparent skin lesions, or masses noted. Head:  normocephalic, balding male hair pattern Eyes:  left eye absent with prosthesis, C cand S clear, pulpil reacts ENT:  ears, nose and throat reveal no gross abnormalities.  Dentition good. Neck:  supple, without massess. No JVD, thyromegaly or carotid bruits. Carotid upstroke normal. Chest:  normal symmetry, clear to auscultation. Cardiac:  regular rhythm, normal S1 and S2, No S3 or S4, no murmurs, gallops or rubs detected. Abdomen:  abdomen soft,non-tender, no masses, no hepatospenomegaly, or aneurysm noted Peripheral Pulses:  fermoral pulses 2+ with no bruits, DP 2+ on right absent on  left, PT 2+on let, 1+ on irght Extremities & Back:  no deformities,  clubbing, cyanosis, erythema or edema observed. Normal muscle strength and tone. Neurological:  no gross motor or sensory deficits noted, affect appropriate, oriented x3. ____________________________ IMPRESSIONS/PLAN  1. Chest pain with atypical features with an abnormal EKG and exercise treadmill test suggestive of ischemia and a possible previous inferior heart shin 2. Cigarette smoking advised to stop 3. Abnormal EKG and exercise test  Recommendations:  Plan cardiac catheterization to further assess anatomy. Cardiac catheterization was discussed with the patient including risks of myocardial infarction, death, stroke, bleeding, arrhythmia, dye allergy, or renal insufficiency. He understands and is willing to proceed. Possibility of percutaneous intervention at the same setting was also discussed with the patient including risks. ____________________________ TODAYS ORDERS  1. Comprehensive Metabolic Panel: Today  2. Complete Blood Count: Today  3. Draw PT/INR: Today  4. PTT: Today  5. Lipid Panel: Today  6. Chest X-ray PA/Lat: today                       ____________________________ Cardiology Physician:  Kerry Hough MD [facility]

## 2015-02-26 NOTE — Progress Notes (Unsigned)
Patient ID: Sean Bridges, male   DOB: January 23, 1946, 69 y.o.   MRN: [id number]

## 2015-02-28 ENCOUNTER — Encounter (HOSPITAL_COMMUNITY)
Admission: RE | Disposition: A | Discharge status: Home / Self Care | Payer: Self-pay | Source: Ambulatory Visit | Attending: Cardiology

## 2015-02-28 ENCOUNTER — Ambulatory Visit (HOSPITAL_COMMUNITY)
Admission: RE | Admit: 2015-02-28 | Discharge: 2015-02-28 | Disposition: A | Discharge status: Home / Self Care | Payer: Medicare Other | Source: Ambulatory Visit | Attending: Cardiology | Admitting: Cardiology

## 2015-02-28 ENCOUNTER — Other Ambulatory Visit: Payer: Self-pay | Admitting: *Deleted

## 2015-02-28 DIAGNOSIS — Z01818 Encounter for other preprocedural examination: Secondary | ICD-10-CM

## 2015-02-28 DIAGNOSIS — I2582 Chronic total occlusion of coronary artery: Secondary | ICD-10-CM | POA: Diagnosis not present

## 2015-02-28 DIAGNOSIS — I2510 Atherosclerotic heart disease of native coronary artery without angina pectoris: Secondary | ICD-10-CM

## 2015-02-28 DIAGNOSIS — R9430 Abnormal result of cardiovascular function study, unspecified: Secondary | ICD-10-CM

## 2015-02-28 DIAGNOSIS — F17210 Nicotine dependence, cigarettes, uncomplicated: Secondary | ICD-10-CM | POA: Insufficient documentation

## 2015-02-28 DIAGNOSIS — Z7982 Long term (current) use of aspirin: Secondary | ICD-10-CM | POA: Diagnosis not present

## 2015-02-28 DIAGNOSIS — R079 Chest pain, unspecified: Secondary | ICD-10-CM | POA: Diagnosis not present

## 2015-02-28 HISTORY — PX: CARDIAC CATHETERIZATION: SHX172

## 2015-02-28 SURGERY — LEFT HEART CATH AND CORONARY ANGIOGRAPHY

## 2015-02-28 MED ORDER — HEPARIN (PORCINE) IN NACL 2-0.9 UNIT/ML-% IJ SOLN
INTRAMUSCULAR | Status: DC | PRN
  Administered 2015-02-28: 09:00:00

## 2015-02-28 MED ORDER — HEPARIN (PORCINE) IN NACL 2-0.9 UNIT/ML-% IJ SOLN
INTRAMUSCULAR | Status: AC
  Filled 2015-02-28: qty 1000

## 2015-02-28 MED ORDER — SODIUM CHLORIDE 0.9 % IV SOLN: 250.0000 mL | INTRAVENOUS | Status: DC | PRN

## 2015-02-28 MED ORDER — LIDOCAINE HCL (PF) 1 % IJ SOLN
INTRAMUSCULAR | Status: DC | PRN
  Administered 2015-02-28: 15 mL

## 2015-02-28 MED ORDER — LIDOCAINE HCL (PF) 1 % IJ SOLN
INTRAMUSCULAR | Status: AC
  Filled 2015-02-28: qty 30

## 2015-02-28 MED ORDER — SODIUM CHLORIDE 0.9 % WEIGHT BASED INFUSION: 1.0000 mL/kg/h | INTRAVENOUS | Status: DC

## 2015-02-28 MED ORDER — OXYCODONE-ACETAMINOPHEN 5-325 MG PO TABS: 1.0000 | ORAL_TABLET | Freq: Four times a day (QID) | ORAL | Status: DC | PRN

## 2015-02-28 MED ORDER — IOHEXOL 350 MG/ML SOLN
INTRAVENOUS | Status: DC | PRN
  Administered 2015-02-28: 45 mL via INTRA_ARTERIAL

## 2015-02-28 MED ORDER — SODIUM CHLORIDE 0.9 % IJ SOLN: 3.0000 mL | INTRAMUSCULAR | Status: DC | PRN

## 2015-02-28 MED ORDER — MIDAZOLAM HCL 2 MG/2ML IJ SOLN
INTRAMUSCULAR | Status: DC | PRN
  Administered 2015-02-28: 1 mg via INTRAVENOUS

## 2015-02-28 MED ORDER — ASPIRIN 81 MG PO CHEW
CHEWABLE_TABLET | ORAL | Status: AC
  Filled 2015-02-28: qty 1

## 2015-02-28 MED ORDER — SODIUM CHLORIDE 0.9 % WEIGHT BASED INFUSION: 3.0000 mL/kg/h | INTRAVENOUS | Status: AC

## 2015-02-28 MED ORDER — SODIUM CHLORIDE 0.9 % WEIGHT BASED INFUSION
3.0000 mL/kg/h | INTRAVENOUS | Status: DC
  Administered 2015-02-28: 3 mL/kg/h via INTRAVENOUS

## 2015-02-28 MED ORDER — MIDAZOLAM HCL 2 MG/2ML IJ SOLN
INTRAMUSCULAR | Status: AC
  Filled 2015-02-28: qty 4

## 2015-02-28 MED ORDER — SODIUM CHLORIDE 0.9 % IJ SOLN: 3.0000 mL | Freq: Two times a day (BID) | INTRAMUSCULAR | Status: DC

## 2015-02-28 MED ORDER — FENTANYL CITRATE (PF) 100 MCG/2ML IJ SOLN
INTRAMUSCULAR | Status: AC
  Filled 2015-02-28: qty 4

## 2015-02-28 MED ORDER — ASPIRIN 81 MG PO CHEW
81.0000 mg | CHEWABLE_TABLET | ORAL | Status: AC
  Administered 2015-02-28: 81 mg via ORAL

## 2015-02-28 SURGICAL SUPPLY — 7 items
CATH INFINITI 5FR MULTPACK ANG (CATHETERS) ×2 IMPLANT
KIT HEART LEFT (KITS) ×2 IMPLANT
PACK CARDIAC CATHETERIZATION (CUSTOM PROCEDURE TRAY) ×2 IMPLANT
SHEATH PINNACLE 5F 10CM (SHEATH) ×2 IMPLANT
SYR MEDRAD MARK V 150ML (SYRINGE) ×2 IMPLANT
TRANSDUCER W/STOPCOCK (MISCELLANEOUS) ×2 IMPLANT
WIRE EMERALD 3MM-J .035X150CM (WIRE) ×2 IMPLANT

## 2015-02-28 NOTE — Interval H&P Note (Signed)
History and Physical Interval Note:  02/28/2015 8:38 AM  Sean Bridges  has presented today for surgery, with the diagnosis of cp  The various methods of treatment have been discussed with the patient and family. After consideration of risks, benefits and other options for treatment, the patient has consented to  Procedure(s): Left Heart Cath and Coronary Angiography (N/A) as a surgical intervention .  The patient's history has been reviewed, patient examined, no change in status, stable for surgery.  I have reviewed the patient's chart and labs.  Questions were answered to the patient's satisfaction.     Ezzard Standing

## 2015-02-28 NOTE — Progress Notes (Signed)
Site area: rt groin fa sheath Site Prior to Removal:  Level 0 Pressure Applied For:  20 minutes Manual:   yes Patient Status During Pull:  stable Post Pull Site:  Level  0 Post Pull Instructions Given:  yes Post Pull Pulses Present: yes Dressing Applied:  tegaderm Bedrest begins @ 0945 Comments:

## 2015-02-28 NOTE — Discharge Instructions (Signed)
Angiogram, Care After Refer to this sheet in the next few weeks. These instructions provide you with information about caring for yourself after your procedure. Your health care provider may also give you more specific instructions. Your treatment has been planned according to current medical practices, but problems sometimes occur. Call your health care provider if you have any problems or questions after your procedure. WHAT TO EXPECT AFTER THE PROCEDURE After your procedure, it is typical to have the following:  Bruising at the catheter insertion site that usually fades within 1-2 weeks.  Blood collecting in the tissue (hematoma) that may be painful to the touch. It should usually decrease in size and tenderness within 1-2 weeks. HOME CARE INSTRUCTIONS  Take medicines only as directed by your health care provider.  You may shower 24-48 hours after the procedure or as directed by your health care provider. Remove the bandage (dressing) and gently wash the site with plain soap and water. Pat the area dry with a clean towel. Do not rub the site, because this may cause bleeding.  Do not take baths, swim, or use a hot tub until your health care provider approves.  Check your insertion site every day for redness, swelling, or drainage.  Do not apply powder or lotion to the site.  Do not lift over 10 lb (4.5 kg) for 5 days after your procedure or as directed by your health care provider.  Ask your health care provider when it is okay to:  Return to work or school.  Resume usual physical activities or sports.  Resume sexual activity.  Do not drive home if you are discharged the same day as the procedure. Have someone else drive you.  You may drive 24 hours after the procedure unless otherwise instructed by your health care provider.  Do not operate machinery or power tools for 24 hours after the procedure or as directed by your health care provider.  If your procedure was done as an  outpatient procedure, which means that you went home the same day as your procedure, a responsible adult should be with you for the first 24 hours after you arrive home.  Keep all follow-up visits as directed by your health care provider. This is important. SEEK MEDICAL CARE IF:  You have a fever.  You have chills.  You have increased bleeding from the catheter insertion site. Hold pressure on the site. SEEK IMMEDIATE MEDICAL CARE IF:  You have unusual pain at the catheter insertion site.  You have redness, warmth, or swelling at the catheter insertion site.  You have drainage (other than a small amount of blood on the dressing) from the catheter insertion site.  The catheter insertion site is bleeding, hold steady pressure on the site and call 911.  The area near or just beyond the catheter insertion site becomes pale, cool, tingly, or numb.   This information is not intended to replace advice given to you by your health care provider. Make sure you discuss any questions you have with your health care provider.   Document Released: 10/24/2004 Document Revised: 04/28/2014 Document Reviewed: 09/08/2012 Elsevier Interactive Patient Education [employer].

## 2015-03-01 ENCOUNTER — Institutional Professional Consult (permissible substitution) (INDEPENDENT_AMBULATORY_CARE_PROVIDER_SITE_OTHER): Payer: Medicare Other | Admitting: Cardiothoracic Surgery

## 2015-03-01 ENCOUNTER — Encounter (HOSPITAL_COMMUNITY): Payer: Self-pay | Admitting: Cardiology

## 2015-03-01 ENCOUNTER — Other Ambulatory Visit: Payer: Self-pay | Admitting: *Deleted

## 2015-03-01 VITALS — BP 100/63 | HR 78 | Resp 20 | Ht 68.0 in | Wt 153.0 lb

## 2015-03-01 DIAGNOSIS — I2510 Atherosclerotic heart disease of native coronary artery without angina pectoris: Secondary | ICD-10-CM

## 2015-03-01 DIAGNOSIS — I200 Unstable angina: Secondary | ICD-10-CM | POA: Diagnosis not present
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
## 2015-03-06 ENCOUNTER — Other Ambulatory Visit (HOSPITAL_COMMUNITY): Payer: Self-pay | Admitting: *Deleted

## 2015-03-06 NOTE — Pre-Procedure Instructions (Addendum)
Ovid Curd  03/06/2015      [id number] - [place], [place] [address] [place] [address] Phone: [phone] Fax: [phone]    Your procedure is scheduled on Thursday, March 08, 2015 at 7:30 AM.  Report to [facility] Entrance "A" Admitting Office at 5:30 AM.  Call this number if you have problems the morning of surgery: [phone]    Remember:  Do not eat food or drink liquids after midnight tonight.    Do not wear jewelry.  Do not wear lotions, powders, or cologne.  You may NOT wear deodorant.  Men may shave face and neck.  Do not bring valuables to the hospital.  [facility] is not responsible for any belongings or valuables.  Contacts, dentures or bridgework may not be worn into surgery.  Leave your suitcase in the car.  After surgery it may be brought to your room.  For patients admitted to the hospital, discharge time will be determined by your treatment team.  Special instructions:  [place] - Preparing for Surgery  Before surgery, you can play an important role.  Because skin is not sterile, your skin needs to be as free of germs as possible.  You can reduce the number of germs on you skin by washing with CHG (chlorahexidine gluconate) soap before surgery.  CHG is an antiseptic cleaner which kills germs and bonds with the skin to continue killing germs even after washing.  Please DO NOT use if you have an allergy to CHG or antibacterial soaps.  If your skin becomes reddened/irritated stop using the CHG and inform your nurse when you arrive at Short Stay.  Do not shave (including legs and underarms) for at least 48 hours prior to the first CHG shower.  You may shave your face.  Please follow these instructions carefully:   1.  Shower with CHG Soap the night before surgery and the morning of Surgery.  2.  If you choose to wash your hair, wash your hair first as usual with  your normal shampoo.  3.  After you shampoo, rinse your hair and body thoroughly to remove the Shampoo.  4.  Use CHG as you would any other liquid soap.  You can apply chg directly to the skin and wash gently with scrungie or a clean washcloth.  5.  Apply the CHG Soap to your body ONLY FROM THE NECK DOWN.   Do not use on open wounds or open sores.  Avoid contact with your eyes, ears, mouth and genitals (private parts).  Wash genitals (private parts) with your normal soap.  6.  Wash thoroughly, paying special attention to the area where your surgery will be performed.  7.  Thoroughly rinse your body with warm water from the neck down.  8.  DO NOT shower/wash with your normal soap after using and rinsing off the CHG Soap.  9.  Pat yourself dry with a clean towel.            10.  Wear clean pajamas.            11.  Place clean sheets on your bed the night of your first shower and do not sleep with pets.  Day of Surgery  Do not apply any lotions/deodorants the morning of surgery.  Please wear clean clothes to the hospital.  Please read over the following fact sheets that you  were given. Pain Booklet, Coughing and Deep Breathing, Blood Transfusion Information, MRSA Information and Surgical Site Infection Prevention

## 2015-03-07 ENCOUNTER — Ambulatory Visit (HOSPITAL_COMMUNITY)
Admission: RE | Admit: 2015-03-07 | Discharge: 2015-03-07 | Disposition: A | Discharge status: Home / Self Care | Payer: Medicare Other | Source: Ambulatory Visit | Attending: Cardiothoracic Surgery | Admitting: Cardiothoracic Surgery

## 2015-03-07 ENCOUNTER — Encounter (HOSPITAL_COMMUNITY)
Admission: RE | Admit: 2015-03-07 | Discharge: 2015-03-07 | Disposition: A | Discharge status: Home / Self Care | Payer: Medicare Other | Source: Ambulatory Visit | Attending: Cardiothoracic Surgery | Admitting: Cardiothoracic Surgery

## 2015-03-07 ENCOUNTER — Ambulatory Visit (HOSPITAL_BASED_OUTPATIENT_CLINIC_OR_DEPARTMENT_OTHER)
Admission: RE | Admit: 2015-03-07 | Discharge: 2015-03-07 | Disposition: A | Discharge status: Home / Self Care | Payer: Medicare Other | Source: Ambulatory Visit | Attending: Cardiothoracic Surgery | Admitting: Cardiothoracic Surgery

## 2015-03-07 ENCOUNTER — Encounter (HOSPITAL_COMMUNITY): Payer: Self-pay

## 2015-03-07 VITALS — BP 104/60 | HR 103 | Temp 98.4°F | Resp 20 | Ht 68.5 in | Wt 161.2 lb

## 2015-03-07 DIAGNOSIS — I2510 Atherosclerotic heart disease of native coronary artery without angina pectoris: Secondary | ICD-10-CM

## 2015-03-07 HISTORY — DX: Reserved for inherently not codable concepts without codable children: IMO0001

## 2015-03-07 HISTORY — DX: Blindness, one eye, unspecified eye: H54.40

## 2015-03-07 HISTORY — DX: Unspecified osteoarthritis, unspecified site: M19.90

## 2015-03-07 HISTORY — DX: Frequency of micturition: R35.0

## 2015-03-07 HISTORY — DX: Acute myocardial infarction, unspecified: I21.9

## 2015-03-07 HISTORY — DX: Presence of spectacles and contact lenses: Z97.3

## 2015-03-07 LAB — PULMONARY FUNCTION TEST
DL/VA % pred: 96 %
DL/VA: 4.34 ml/min/mmHg/L
DLCO cor % pred: 90 %
DLCO cor: 26.73 ml/min/mmHg
DLCO unc % pred: 94 %
DLCO unc: 27.93 ml/min/mmHg
FEF 25-75 Post: 3.92 L/sec
FEF 25-75 Pre: 1.49 L/sec
FEF2575-%Change-Post: 163 %
FEF2575-%Pred-Post: 161 %
FEF2575-%Pred-Pre: 61 %
FEV1-%Change-Post: 56 %
FEV1-%Pred-Post: 108 %
FEV1-%Pred-Pre: 69 %
FEV1-Post: 3.35 L
FEV1-Pre: 2.14 L
FEV1FVC-%Change-Post: 48 %
FEV1FVC-%Pred-Pre: 73 %
FEV6-%Change-Post: 7 %
FEV6-%Pred-Post: 103 %
FEV6-%Pred-Pre: 96 %
FEV6-Post: 4.11 L
FEV6-Pre: 3.82 L
FEV6FVC-%Change-Post: 2 %
FEV6FVC-%Pred-Post: 105 %
FEV6FVC-%Pred-Pre: 102 %
FVC-%Change-Post: 5 %
FVC-%Pred-Post: 98 %
FVC-%Pred-Pre: 93 %
FVC-Post: 4.11 L
FVC-Pre: 3.91 L
Post FEV1/FVC ratio: 82 %
Post FEV6/FVC ratio: 100 %
Pre FEV1/FVC ratio: 55 %
Pre FEV6/FVC Ratio: 98 %
RV % pred: 106 %
RV: 2.42 L
TLC % pred: 95 %
TLC: 6.33 L

## 2015-03-07 LAB — BLOOD GAS, ARTERIAL
Acid-Base Excess: 1.3 mmol/L (ref 0.0–2.0)
Bicarbonate: 25.2 mEq/L — ABNORMAL HIGH (ref 20.0–24.0)
Drawn by: 49984
FIO2: 0.21
O2 Saturation: 97.1 %
Patient temperature: 98.6
TCO2: 26.4 mmol/L (ref 0–100)
pCO2 arterial: 38.5 mmHg (ref 35.0–45.0)
pH, Arterial: 7.432 (ref 7.350–7.450)
pO2, Arterial: 90.2 mmHg (ref 80.0–100.0)

## 2015-03-07 LAB — CBC
HCT: 49 % (ref 39.0–52.0)
Hemoglobin: 16.3 g/dL (ref 13.0–17.0)
MCH: 30.5 pg (ref 26.0–34.0)
MCHC: 33.3 g/dL (ref 30.0–36.0)
MCV: 91.8 fL (ref 78.0–100.0)
Platelets: 241 10*3/uL (ref 150–400)
RBC: 5.34 MIL/uL (ref 4.22–5.81)
RDW: 13.1 % (ref 11.5–15.5)
WBC: 5.8 10*3/uL (ref 4.0–10.5)

## 2015-03-07 LAB — COMPREHENSIVE METABOLIC PANEL
ALT: 25 U/L (ref 17–63)
AST: 24 U/L (ref 15–41)
Albumin: 3.4 g/dL — ABNORMAL LOW (ref 3.5–5.0)
Alkaline Phosphatase: 103 U/L (ref 38–126)
Anion gap: 9 (ref 5–15)
BUN: 11 mg/dL (ref 6–20)
CO2: 24 mmol/L (ref 22–32)
Calcium: 9.1 mg/dL (ref 8.9–10.3)
Chloride: 104 mmol/L (ref 101–111)
Creatinine, Ser: 0.91 mg/dL (ref 0.61–1.24)
GFR calc Af Amer: >60 mL/min (ref >60)
GFR calc non Af Amer: >60 mL/min (ref >60)
Glucose, Bld: 123 mg/dL — ABNORMAL HIGH (ref 65–99)
Potassium: 4.6 mmol/L (ref 3.5–5.1)
Sodium: 137 mmol/L (ref 135–145)
Total Bilirubin: 0.6 mg/dL (ref 0.3–1.2)
Total Protein: 6.1 g/dL — ABNORMAL LOW (ref 6.5–8.1)

## 2015-03-07 LAB — URINALYSIS, ROUTINE W REFLEX MICROSCOPIC
Bilirubin Urine: NEGATIVE
Glucose, UA: NEGATIVE mg/dL
Hgb urine dipstick: NEGATIVE
Ketones, ur: NEGATIVE mg/dL
Leukocytes, UA: NEGATIVE
Nitrite: NEGATIVE
Protein, ur: NEGATIVE mg/dL
Specific Gravity, Urine: 1.017 (ref 1.005–1.030)
pH: 7 (ref 5.0–8.0)

## 2015-03-07 LAB — SURGICAL PCR SCREEN
MRSA, PCR: NEGATIVE
Staphylococcus aureus: NEGATIVE

## 2015-03-07 LAB — APTT: aPTT: 31 seconds (ref 24–37)

## 2015-03-07 LAB — PROTIME-INR
INR: 1.06 (ref 0.00–1.49)
Prothrombin Time: 14 seconds (ref 11.6–15.2)

## 2015-03-07 LAB — ABO/RH: ABO/RH(D): A POS

## 2015-03-07 MED ORDER — AMINOCAPROIC ACID 250 MG/ML IV SOLN
INTRAVENOUS | Status: AC
  Administered 2015-03-08: 69.8 mL/h via INTRAVENOUS
  Filled 2015-03-07: qty 40

## 2015-03-07 MED ORDER — DEXTROSE 5 % IV SOLN
30.0000 ug/min | INTRAVENOUS | Status: DC
  Filled 2015-03-07: qty 2

## 2015-03-07 MED ORDER — DEXTROSE 5 % IV SOLN
1.5000 g | INTRAVENOUS | Status: AC
  Administered 2015-03-08: .75 g via INTRAVENOUS
  Administered 2015-03-08: 1.5 g via INTRAVENOUS
  Filled 2015-03-07: qty 1.5

## 2015-03-07 MED ORDER — DEXMEDETOMIDINE HCL IN NACL 400 MCG/100ML IV SOLN
0.1000 ug/kg/h | INTRAVENOUS | Status: AC
  Administered 2015-03-08: .3 ug/kg/h via INTRAVENOUS
  Filled 2015-03-07: qty 100

## 2015-03-07 MED ORDER — DEXTROSE 5 % IV SOLN
750.0000 mg | INTRAVENOUS | Status: DC
  Filled 2015-03-07: qty 750

## 2015-03-07 MED ORDER — HEPARIN SODIUM (PORCINE) 1000 UNIT/ML IJ SOLN
INTRAMUSCULAR | Status: DC
  Filled 2015-03-07: qty 30

## 2015-03-07 MED ORDER — PLASMA-LYTE 148 IV SOLN
INTRAVENOUS | Status: AC
  Administered 2015-03-08: 500 mL
  Filled 2015-03-07: qty 2.5

## 2015-03-07 MED ORDER — EPINEPHRINE HCL 1 MG/ML IJ SOLN
0.0000 ug/min | INTRAMUSCULAR | Status: DC
  Filled 2015-03-07: qty 4

## 2015-03-07 MED ORDER — ALBUTEROL SULFATE (2.5 MG/3ML) 0.083% IN NEBU
2.5000 mg | INHALATION_SOLUTION | Freq: Once | RESPIRATORY_TRACT | Status: AC
  Administered 2015-03-07: 2.5 mg via RESPIRATORY_TRACT

## 2015-03-07 MED ORDER — VANCOMYCIN HCL 10 G IV SOLR
1250.0000 mg | INTRAVENOUS | Status: AC
  Administered 2015-03-08: 1250 mg via INTRAVENOUS
  Filled 2015-03-07: qty 1250

## 2015-03-07 MED ORDER — NITROGLYCERIN IN D5W 200-5 MCG/ML-% IV SOLN
2.0000 ug/min | INTRAVENOUS | Status: AC
  Administered 2015-03-08: 5 ug/min via INTRAVENOUS
  Filled 2015-03-07: qty 250

## 2015-03-07 MED ORDER — POTASSIUM CHLORIDE 2 MEQ/ML IV SOLN
80.0000 meq | INTRAVENOUS | Status: DC
  Filled 2015-03-07: qty 40

## 2015-03-07 MED ORDER — CHLORHEXIDINE GLUCONATE 4 % EX LIQD: 30.0000 mL | CUTANEOUS | Status: DC

## 2015-03-07 MED ORDER — MAGNESIUM SULFATE 50 % IJ SOLN
40.0000 meq | INTRAMUSCULAR | Status: DC
  Filled 2015-03-07: qty 10

## 2015-03-07 MED ORDER — SODIUM CHLORIDE 0.9 % IV SOLN
INTRAVENOUS | Status: AC
  Administered 2015-03-08: .9 [IU]/h via INTRAVENOUS
  Filled 2015-03-07: qty 2.5

## 2015-03-07 MED ORDER — DOPAMINE-DEXTROSE 3.2-5 MG/ML-% IV SOLN
0.0000 ug/kg/min | INTRAVENOUS | Status: AC
  Administered 2015-03-08: 3 ug/kg/min via INTRAVENOUS
  Filled 2015-03-07: qty 250

## 2015-03-07 MED ORDER — METOPROLOL TARTRATE 12.5 MG HALF TABLET: 12.5000 mg | ORAL_TABLET | Freq: Once | ORAL | Status: DC

## 2015-03-07 MED ORDER — CHLORHEXIDINE GLUCONATE 0.12 % MT SOLN
15.0000 mL | OROMUCOSAL | Status: DC
  Filled 2015-03-07: qty 15

## 2015-03-07 NOTE — Progress Notes (Signed)
VASCULAR LAB PRELIMINARY  PRELIMINARY  PRELIMINARY  PRELIMINARY  Pre-op Cardiac Surgery  Carotid Findings:  Right:  1-39% ICA stenosis.  Left:  Occluded CCA with what appears to be a retrograde ECA feeding the ICA.  Upper Extremity Right Left  Brachial Pressures 90  Triphasic  102 Triphasic   Radial Waveforms Triphasic  Triphasic   Ulnar Waveforms Triphasic  Triphasic   Palmar Arch (Allen's Test) Doppler obliterates with radial compression.  Normal with ulnar compression Doppler obliterates with both radial and ulnar compression.  Note patient had compression bandage from arterial blood draw.    Lower  Extremity Right Left  Dorsalis Pedis Triphasic  Triphasic   Anterior Tibial    Posterior Tibial Triphasic  Triphasic   Ankle/Brachial Indices        Sequoyah Ramone, RVT 03/07/2015, 12:00 PM
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
## 2015-03-07 NOTE — Progress Notes (Signed)
PCP - Dr. [name] Cardiologist - Dr. Wynonia Lawman  EKG - 02/28/15 CXR- 02/26/15  Cardiac Cath - 02/28/15  Patient states that he has been having a dull chest ache for the past 6 months.    EKG, Stress test, last OV notes have been requested from PCP's office.

## 2015-03-08 ENCOUNTER — Inpatient Hospital Stay (HOSPITAL_COMMUNITY): Payer: Medicare Other | Admitting: Certified Registered Nurse Anesthetist

## 2015-03-08 ENCOUNTER — Inpatient Hospital Stay (HOSPITAL_COMMUNITY): Payer: Medicare Other

## 2015-03-08 ENCOUNTER — Encounter (HOSPITAL_COMMUNITY): Payer: Self-pay | Admitting: *Deleted

## 2015-03-08 ENCOUNTER — Inpatient Hospital Stay (HOSPITAL_COMMUNITY)
Admission: RE | Admit: 2015-03-08 | Discharge: 2015-03-13 | DRG: 236 | Disposition: A | Discharge status: Home / Self Care | Payer: Medicare Other | Source: Ambulatory Visit | Attending: Cardiothoracic Surgery | Admitting: Cardiothoracic Surgery

## 2015-03-08 ENCOUNTER — Encounter (HOSPITAL_COMMUNITY)
Admission: RE | Disposition: A | Discharge status: Home / Self Care | Payer: Medicare Other | Source: Ambulatory Visit | Attending: Cardiothoracic Surgery

## 2015-03-08 DIAGNOSIS — Z9001 Acquired absence of eye: Secondary | ICD-10-CM

## 2015-03-08 DIAGNOSIS — F17200 Nicotine dependence, unspecified, uncomplicated: Secondary | ICD-10-CM | POA: Diagnosis present

## 2015-03-08 DIAGNOSIS — I6522 Occlusion and stenosis of left carotid artery: Secondary | ICD-10-CM | POA: Diagnosis present

## 2015-03-08 DIAGNOSIS — I25110 Atherosclerotic heart disease of native coronary artery with unstable angina pectoris: Secondary | ICD-10-CM | POA: Diagnosis not present

## 2015-03-08 DIAGNOSIS — L8990 Pressure ulcer of unspecified site, unspecified stage: Secondary | ICD-10-CM | POA: Insufficient documentation

## 2015-03-08 DIAGNOSIS — I440 Atrioventricular block, first degree: Secondary | ICD-10-CM | POA: Diagnosis present

## 2015-03-08 DIAGNOSIS — Z8249 Family history of ischemic heart disease and other diseases of the circulatory system: Secondary | ICD-10-CM | POA: Diagnosis not present

## 2015-03-08 DIAGNOSIS — R0602 Shortness of breath: Secondary | ICD-10-CM

## 2015-03-08 DIAGNOSIS — Z7982 Long term (current) use of aspirin: Secondary | ICD-10-CM | POA: Diagnosis not present

## 2015-03-08 DIAGNOSIS — Z79899 Other long term (current) drug therapy: Secondary | ICD-10-CM | POA: Diagnosis not present

## 2015-03-08 DIAGNOSIS — R7303 Prediabetes: Secondary | ICD-10-CM | POA: Diagnosis present

## 2015-03-08 DIAGNOSIS — I200 Unstable angina: Secondary | ICD-10-CM

## 2015-03-08 DIAGNOSIS — Z823 Family history of stroke: Secondary | ICD-10-CM | POA: Diagnosis not present

## 2015-03-08 DIAGNOSIS — D62 Acute posthemorrhagic anemia: Secondary | ICD-10-CM | POA: Diagnosis not present

## 2015-03-08 DIAGNOSIS — R079 Chest pain, unspecified: Secondary | ICD-10-CM | POA: Diagnosis present

## 2015-03-08 DIAGNOSIS — Z951 Presence of aortocoronary bypass graft: Secondary | ICD-10-CM

## 2015-03-08 DIAGNOSIS — I2510 Atherosclerotic heart disease of native coronary artery without angina pectoris: Secondary | ICD-10-CM

## 2015-03-08 HISTORY — PX: TEE WITHOUT CARDIOVERSION: SHX5443

## 2015-03-08 HISTORY — PX: CORONARY ARTERY BYPASS GRAFT: SHX141

## 2015-03-08 HISTORY — DX: Unstable angina: I20.0

## 2015-03-08 LAB — POCT I-STAT, CHEM 8
BUN: 11 mg/dL (ref 6–20)
BUN: 10 mg/dL (ref 6–20)
BUN: 8 mg/dL (ref 6–20)
BUN: 9 mg/dL (ref 6–20)
BUN: 7 mg/dL (ref 6–20)
BUN: 7 mg/dL (ref 6–20)
Calcium, Ion: 1.18 mmol/L (ref 1.13–1.30)
Calcium, Ion: 0.96 mmol/L — ABNORMAL LOW (ref 1.13–1.30)
Calcium, Ion: 1.24 mmol/L (ref 1.13–1.30)
Calcium, Ion: 1.04 mmol/L — ABNORMAL LOW (ref 1.13–1.30)
Calcium, Ion: 0.87 mmol/L — ABNORMAL LOW (ref 1.13–1.30)
Calcium, Ion: 1.19 mmol/L (ref 1.13–1.30)
Chloride: 102 mmol/L (ref 101–111)
Chloride: 104 mmol/L (ref 101–111)
Chloride: 98 mmol/L — ABNORMAL LOW (ref 101–111)
Chloride: 101 mmol/L (ref 101–111)
Chloride: 102 mmol/L (ref 101–111)
Chloride: 103 mmol/L (ref 101–111)
Creatinine, Ser: 0.6 mg/dL — ABNORMAL LOW (ref 0.61–1.24)
Creatinine, Ser: 0.4 mg/dL — ABNORMAL LOW (ref 0.61–1.24)
Creatinine, Ser: 0.5 mg/dL — ABNORMAL LOW (ref 0.61–1.24)
Creatinine, Ser: 0.6 mg/dL — ABNORMAL LOW (ref 0.61–1.24)
Creatinine, Ser: 0.5 mg/dL — ABNORMAL LOW (ref 0.61–1.24)
Creatinine, Ser: 0.7 mg/dL (ref 0.61–1.24)
Glucose, Bld: 106 mg/dL — ABNORMAL HIGH (ref 65–99)
Glucose, Bld: 109 mg/dL — ABNORMAL HIGH (ref 65–99)
Glucose, Bld: 123 mg/dL — ABNORMAL HIGH (ref 65–99)
Glucose, Bld: 144 mg/dL — ABNORMAL HIGH (ref 65–99)
Glucose, Bld: 147 mg/dL — ABNORMAL HIGH (ref 65–99)
Glucose, Bld: 187 mg/dL — ABNORMAL HIGH (ref 65–99)
HCT: 48 % (ref 39.0–52.0)
HCT: 34 % — ABNORMAL LOW (ref 39.0–52.0)
HCT: 48 % (ref 39.0–52.0)
HCT: 34 % — ABNORMAL LOW (ref 39.0–52.0)
HCT: 29 % — ABNORMAL LOW (ref 39.0–52.0)
HCT: 32 % — ABNORMAL LOW (ref 39.0–52.0)
Hemoglobin: 16.3 g/dL (ref 13.0–17.0)
Hemoglobin: 11.6 g/dL — ABNORMAL LOW (ref 13.0–17.0)
Hemoglobin: 16.3 g/dL (ref 13.0–17.0)
Hemoglobin: 11.6 g/dL — ABNORMAL LOW (ref 13.0–17.0)
Hemoglobin: 9.9 g/dL — ABNORMAL LOW (ref 13.0–17.0)
Hemoglobin: 10.9 g/dL — ABNORMAL LOW (ref 13.0–17.0)
Potassium: 4.3 mmol/L (ref 3.5–5.1)
Potassium: 4.5 mmol/L (ref 3.5–5.1)
Potassium: 4.5 mmol/L (ref 3.5–5.1)
Potassium: 4.6 mmol/L (ref 3.5–5.1)
Potassium: 3.8 mmol/L (ref 3.5–5.1)
Potassium: 4.3 mmol/L (ref 3.5–5.1)
Sodium: 140 mmol/L (ref 135–145)
Sodium: 137 mmol/L (ref 135–145)
Sodium: 143 mmol/L (ref 135–145)
Sodium: 140 mmol/L (ref 135–145)
Sodium: 142 mmol/L (ref 135–145)
Sodium: 140 mmol/L (ref 135–145)
TCO2: 27 mmol/L (ref 0–100)
TCO2: 25 mmol/L (ref 0–100)
TCO2: 28 mmol/L (ref 0–100)
TCO2: 25 mmol/L (ref 0–100)
TCO2: 25 mmol/L (ref 0–100)
TCO2: 25 mmol/L (ref 0–100)

## 2015-03-08 LAB — CBC
HCT: 34.4 % — ABNORMAL LOW (ref 39.0–52.0)
HCT: 32.9 % — ABNORMAL LOW (ref 39.0–52.0)
Hemoglobin: 11.3 g/dL — ABNORMAL LOW (ref 13.0–17.0)
Hemoglobin: 10.8 g/dL — ABNORMAL LOW (ref 13.0–17.0)
MCH: 30.1 pg (ref 26.0–34.0)
MCH: 30.3 pg (ref 26.0–34.0)
MCHC: 32.8 g/dL (ref 30.0–36.0)
MCHC: 32.8 g/dL (ref 30.0–36.0)
MCV: 91.7 fL (ref 78.0–100.0)
MCV: 92.4 fL (ref 78.0–100.0)
PLATELETS: 158 10*3/uL (ref 150–400)
Platelets: 190 10*3/uL (ref 150–400)
RBC: 3.75 MIL/uL — AB (ref 4.22–5.81)
RBC: 3.56 MIL/uL — ABNORMAL LOW (ref 4.22–5.81)
RDW: 13.1 % (ref 11.5–15.5)
RDW: 13 % (ref 11.5–15.5)
WBC: 9.7 10*3/uL (ref 4.0–10.5)
WBC: 12.1 10*3/uL — ABNORMAL HIGH (ref 4.0–10.5)

## 2015-03-08 LAB — POCT I-STAT 3, ART BLOOD GAS (G3+)
Acid-base deficit: 1 mmol/L (ref 0.0–2.0)
Acid-base deficit: 1 mmol/L (ref 0.0–2.0)
Bicarbonate: 25.1 mEq/L — ABNORMAL HIGH (ref 20.0–24.0)
Bicarbonate: 24.1 mEq/L — ABNORMAL HIGH (ref 20.0–24.0)
Bicarbonate: 26.1 mEq/L — ABNORMAL HIGH (ref 20.0–24.0)
Bicarbonate: 26.1 mEq/L — ABNORMAL HIGH (ref 20.0–24.0)
O2 Saturation: 100 %
O2 Saturation: 100 %
O2 Saturation: 94 %
O2 Saturation: 96 %
Patient temperature: 35.7
Patient temperature: 98.3
TCO2: 26 mmol/L (ref 0–100)
TCO2: 25 mmol/L (ref 0–100)
TCO2: 28 mmol/L (ref 0–100)
TCO2: 28 mmol/L (ref 0–100)
pCO2 arterial: 42.2 mmHg (ref 35.0–45.0)
pCO2 arterial: 42.6 mmHg (ref 35.0–45.0)
pCO2 arterial: 46.3 mmHg — ABNORMAL HIGH (ref 35.0–45.0)
pCO2 arterial: 51.5 mmHg — ABNORMAL HIGH (ref 35.0–45.0)
pH, Arterial: 7.383 (ref 7.350–7.450)
pH, Arterial: 7.361 (ref 7.350–7.450)
pH, Arterial: 7.354 (ref 7.350–7.450)
pH, Arterial: 7.312 — ABNORMAL LOW (ref 7.350–7.450)
pO2, Arterial: 301 mmHg — ABNORMAL HIGH (ref 80.0–100.0)
pO2, Arterial: 224 mmHg — ABNORMAL HIGH (ref 80.0–100.0)
pO2, Arterial: 68 mmHg — ABNORMAL LOW (ref 80.0–100.0)
pO2, Arterial: 86 mmHg (ref 80.0–100.0)

## 2015-03-08 LAB — POCT I-STAT 4, (NA,K, GLUC, HGB,HCT)
Glucose, Bld: 116 mg/dL — ABNORMAL HIGH (ref 65–99)
HCT: 34 % — ABNORMAL LOW (ref 39.0–52.0)
Hemoglobin: 11.6 g/dL — ABNORMAL LOW (ref 13.0–17.0)
Potassium: 3.3 mmol/L — ABNORMAL LOW (ref 3.5–5.1)
Sodium: 143 mmol/L (ref 135–145)

## 2015-03-08 LAB — APTT: APTT: 39 s — AB (ref 24–37)

## 2015-03-08 LAB — GLUCOSE, CAPILLARY
Glucose-Capillary: 111 mg/dL — ABNORMAL HIGH (ref 65–99)
Glucose-Capillary: 119 mg/dL — ABNORMAL HIGH (ref 65–99)
Glucose-Capillary: 124 mg/dL — ABNORMAL HIGH (ref 65–99)
Glucose-Capillary: 141 mg/dL — ABNORMAL HIGH (ref 65–99)
Glucose-Capillary: 165 mg/dL — ABNORMAL HIGH (ref 65–99)
Glucose-Capillary: 202 mg/dL — ABNORMAL HIGH (ref 65–99)
Glucose-Capillary: 50 mg/dL — ABNORMAL LOW (ref 65–99)
Glucose-Capillary: 81 mg/dL (ref 65–99)
Glucose-Capillary: 97 mg/dL (ref 65–99)

## 2015-03-08 LAB — HEMOGLOBIN AND HEMATOCRIT, BLOOD
HCT: 34.6 % — ABNORMAL LOW (ref 39.0–52.0)
Hemoglobin: 11.4 g/dL — ABNORMAL LOW (ref 13.0–17.0)

## 2015-03-08 LAB — MAGNESIUM: Magnesium: 2.9 mg/dL — ABNORMAL HIGH (ref 1.7–2.4)

## 2015-03-08 LAB — PROTIME-INR
INR: 1.67 — ABNORMAL HIGH (ref 0.00–1.49)
PROTHROMBIN TIME: 19.7 s — AB (ref 11.6–15.2)

## 2015-03-08 LAB — CREATININE, SERUM
Creatinine, Ser: 0.82 mg/dL (ref 0.61–1.24)
GFR calc Af Amer: >60 mL/min (ref >60)
GFR calc non Af Amer: >60 mL/min (ref >60)

## 2015-03-08 LAB — PLATELET COUNT: Platelets: 182 10*3/uL (ref 150–400)

## 2015-03-08 LAB — HEMOGLOBIN A1C
Hgb A1c MFr Bld: 6.1 % — ABNORMAL HIGH (ref 4.8–5.6)
Mean Plasma Glucose: 128 mg/dL

## 2015-03-08 LAB — POCT I-STAT GLUCOSE
Glucose, Bld: 125 mg/dL — ABNORMAL HIGH (ref 65–99)
Operator id: 173792

## 2015-03-08 SURGERY — CORONARY ARTERY BYPASS GRAFTING (CABG)
Anesthesia: General | Site: Chest

## 2015-03-08 MED ORDER — ACETAMINOPHEN 650 MG RE SUPP
650.0000 mg | Freq: Once | RECTAL | Status: AC
  Administered 2015-03-08: 650 mg via RECTAL

## 2015-03-08 MED ORDER — PHENYLEPHRINE 40 MCG/ML (10ML) SYRINGE FOR IV PUSH (FOR BLOOD PRESSURE SUPPORT)
PREFILLED_SYRINGE | INTRAVENOUS | Status: AC
  Filled 2015-03-08: qty 10

## 2015-03-08 MED ORDER — ARTIFICIAL TEARS OP OINT
TOPICAL_OINTMENT | OPHTHALMIC | Status: AC
  Filled 2015-03-08: qty 3.5

## 2015-03-08 MED ORDER — DEXMEDETOMIDINE HCL IN NACL 400 MCG/100ML IV SOLN
0.4000 ug/kg/h | INTRAVENOUS | Status: DC
  Filled 2015-03-08: qty 100

## 2015-03-08 MED ORDER — ALBUMIN HUMAN 5 % IV SOLN
250.0000 mL | INTRAVENOUS | Status: DC | PRN
  Administered 2015-03-08: 250 mL via INTRAVENOUS

## 2015-03-08 MED ORDER — PROTAMINE SULFATE 10 MG/ML IV SOLN
INTRAVENOUS | Status: AC
  Filled 2015-03-08: qty 25

## 2015-03-08 MED ORDER — HEPARIN SODIUM (PORCINE) 1000 UNIT/ML IJ SOLN
INTRAMUSCULAR | Status: AC
  Filled 2015-03-08: qty 1

## 2015-03-08 MED ORDER — MORPHINE SULFATE (PF) 2 MG/ML IV SOLN
1.0000 mg | INTRAVENOUS | Status: AC | PRN
  Administered 2015-03-08: 2 mg via INTRAVENOUS

## 2015-03-08 MED ORDER — ONDANSETRON HCL 4 MG/2ML IJ SOLN
4.0000 mg | Freq: Four times a day (QID) | INTRAMUSCULAR | Status: DC | PRN
  Administered 2015-03-08 – 2015-03-09 (×3): 4 mg via INTRAVENOUS
  Filled 2015-03-08 (×3): qty 2

## 2015-03-08 MED ORDER — SODIUM CHLORIDE 0.9 % IV SOLN
INTRAVENOUS | Status: DC
  Administered 2015-03-08: 15:00:00 via INTRAVENOUS

## 2015-03-08 MED ORDER — PHENYLEPHRINE HCL 10 MG/ML IJ SOLN
10.0000 mg | INTRAVENOUS | Status: DC | PRN
  Administered 2015-03-08: 20 ug/min via INTRAVENOUS

## 2015-03-08 MED ORDER — SODIUM CHLORIDE 0.9 % IJ SOLN: 3.0000 mL | INTRAMUSCULAR | Status: DC | PRN

## 2015-03-08 MED ORDER — METOPROLOL TARTRATE 25 MG/10 ML ORAL SUSPENSION
12.5000 mg | Freq: Two times a day (BID) | ORAL | Status: DC
  Filled 2015-03-08 (×4): qty 5

## 2015-03-08 MED ORDER — ALBUMIN HUMAN 5 % IV SOLN
250.0000 mL | INTRAVENOUS | Status: AC | PRN
  Administered 2015-03-08 (×2): 250 mL via INTRAVENOUS
  Filled 2015-03-08: qty 250

## 2015-03-08 MED ORDER — TRAMADOL HCL 50 MG PO TABS: 50.0000 mg | ORAL_TABLET | ORAL | Status: DC | PRN

## 2015-03-08 MED ORDER — ROCURONIUM BROMIDE 50 MG/5ML IV SOLN
INTRAVENOUS | Status: AC
  Filled 2015-03-08: qty 2

## 2015-03-08 MED ORDER — SODIUM CHLORIDE 0.9 % IV SOLN
INTRAVENOUS | Status: DC
  Administered 2015-03-08: 2.1 [IU]/h via INTRAVENOUS
  Filled 2015-03-08: qty 2.5

## 2015-03-08 MED ORDER — SODIUM CHLORIDE 0.9 % IV SOLN: 250.0000 mL | INTRAVENOUS | Status: DC

## 2015-03-08 MED ORDER — DEXTROSE 50 % IV SOLN
INTRAVENOUS | Status: AC
  Filled 2015-03-08: qty 50

## 2015-03-08 MED ORDER — INSULIN REGULAR BOLUS VIA INFUSION
0.0000 [IU] | Freq: Three times a day (TID) | INTRAVENOUS | Status: DC
  Filled 2015-03-08: qty 10

## 2015-03-08 MED ORDER — LACTATED RINGERS IV SOLN
INTRAVENOUS | Status: DC
  Administered 2015-03-08: 16:00:00 via INTRAVENOUS

## 2015-03-08 MED ORDER — MIDAZOLAM HCL 2 MG/2ML IJ SOLN
2.0000 mg | INTRAMUSCULAR | Status: DC | PRN
  Administered 2015-03-08: 2 mg via INTRAVENOUS
  Filled 2015-03-08: qty 2

## 2015-03-08 MED ORDER — BISACODYL 5 MG PO TBEC
10.0000 mg | DELAYED_RELEASE_TABLET | Freq: Every day | ORAL | Status: DC
  Administered 2015-03-09 – 2015-03-10 (×2): 10 mg via ORAL
  Filled 2015-03-08 (×2): qty 2

## 2015-03-08 MED ORDER — ALBUMIN HUMAN 5 % IV SOLN
INTRAVENOUS | Status: DC | PRN
  Administered 2015-03-08: 13:00:00 via INTRAVENOUS

## 2015-03-08 MED ORDER — 0.9 % SODIUM CHLORIDE (POUR BTL) OPTIME
TOPICAL | Status: DC | PRN
  Administered 2015-03-08: 5000 mL

## 2015-03-08 MED ORDER — LACTATED RINGERS IV SOLN: INTRAVENOUS | Status: DC

## 2015-03-08 MED ORDER — DEXMEDETOMIDINE HCL IN NACL 200 MCG/50ML IV SOLN: 0.0000 ug/kg/h | INTRAVENOUS | Status: DC

## 2015-03-08 MED ORDER — ROCURONIUM BROMIDE 50 MG/5ML IV SOLN
INTRAVENOUS | Status: AC
  Filled 2015-03-08: qty 1

## 2015-03-08 MED ORDER — POTASSIUM CHLORIDE 10 MEQ/50ML IV SOLN
10.0000 meq | INTRAVENOUS | Status: AC
  Administered 2015-03-08 (×3): 10 meq via INTRAVENOUS

## 2015-03-08 MED ORDER — SODIUM CHLORIDE 0.45 % IV SOLN
INTRAVENOUS | Status: DC | PRN
  Administered 2015-03-08: 15:00:00 via INTRAVENOUS

## 2015-03-08 MED ORDER — BISACODYL 10 MG RE SUPP: 10.0000 mg | Freq: Every day | RECTAL | Status: DC

## 2015-03-08 MED ORDER — MEPERIDINE HCL 25 MG/ML IJ SOLN: 12.5000 mg | INTRAMUSCULAR | Status: DC | PRN

## 2015-03-08 MED ORDER — LACTATED RINGERS IV SOLN
INTRAVENOUS | Status: DC | PRN
  Administered 2015-03-08: 07:00:00 via INTRAVENOUS

## 2015-03-08 MED ORDER — PANTOPRAZOLE SODIUM 40 MG PO TBEC
40.0000 mg | DELAYED_RELEASE_TABLET | Freq: Every day | ORAL | Status: DC
  Administered 2015-03-10: 40 mg via ORAL
  Filled 2015-03-08: qty 1

## 2015-03-08 MED ORDER — LEVALBUTEROL HCL 1.25 MG/0.5ML IN NEBU
1.2500 mg | INHALATION_SOLUTION | Freq: Four times a day (QID) | RESPIRATORY_TRACT | Status: DC
  Administered 2015-03-08 – 2015-03-10 (×7): 1.25 mg via RESPIRATORY_TRACT
  Filled 2015-03-08 (×15): qty 0.5

## 2015-03-08 MED ORDER — POTASSIUM CHLORIDE 10 MEQ/50ML IV SOLN
10.0000 meq | INTRAVENOUS | Status: AC
  Administered 2015-03-08 (×2): 10 meq via INTRAVENOUS

## 2015-03-08 MED ORDER — PROPOFOL 10 MG/ML IV BOLUS
INTRAVENOUS | Status: DC | PRN
  Administered 2015-03-08: 50 mg via INTRAVENOUS

## 2015-03-08 MED ORDER — EPHEDRINE SULFATE 50 MG/ML IJ SOLN
INTRAMUSCULAR | Status: AC
  Filled 2015-03-08: qty 1

## 2015-03-08 MED ORDER — SODIUM CHLORIDE 0.9 % IJ SOLN
INTRAMUSCULAR | Status: AC
  Filled 2015-03-08: qty 20

## 2015-03-08 MED ORDER — MAGNESIUM SULFATE 4 GM/100ML IV SOLN
4.0000 g | Freq: Once | INTRAVENOUS | Status: AC
  Administered 2015-03-08: 4 g via INTRAVENOUS
  Filled 2015-03-08: qty 100

## 2015-03-08 MED ORDER — DEXTROSE 50 % IV SOLN
50.0000 mL | Freq: Once | INTRAVENOUS | Status: AC
  Administered 2015-03-08: 20 mL via INTRAVENOUS

## 2015-03-08 MED ORDER — DEXTROSE 5 % IV SOLN
0.0000 ug/min | INTRAVENOUS | Status: DC
  Administered 2015-03-08: 20 ug/min via INTRAVENOUS
  Administered 2015-03-09: 25 ug/min via INTRAVENOUS
  Filled 2015-03-08 (×2): qty 2

## 2015-03-08 MED ORDER — LIDOCAINE HCL (CARDIAC) 20 MG/ML IV SOLN
INTRAVENOUS | Status: AC
  Filled 2015-03-08: qty 5

## 2015-03-08 MED ORDER — SODIUM CHLORIDE 0.9 % IJ SOLN
INTRAMUSCULAR | Status: DC | PRN
  Administered 2015-03-08 (×3): 4 mL via TOPICAL

## 2015-03-08 MED ORDER — LACTATED RINGERS IV SOLN
INTRAVENOUS | Status: DC | PRN
  Administered 2015-03-08 (×2): via INTRAVENOUS

## 2015-03-08 MED ORDER — CEFUROXIME SODIUM 1.5 G IJ SOLR
1.5000 g | Freq: Two times a day (BID) | INTRAMUSCULAR | Status: AC
  Administered 2015-03-08 – 2015-03-10 (×4): 1.5 g via INTRAVENOUS
  Filled 2015-03-08 (×4): qty 1.5

## 2015-03-08 MED ORDER — ACETAMINOPHEN 160 MG/5ML PO SOLN: 1000.0000 mg | Freq: Four times a day (QID) | ORAL | Status: DC

## 2015-03-08 MED ORDER — SODIUM CHLORIDE 0.9 % IV SOLN
0.5000 g/h | Freq: Once | INTRAVENOUS | Status: DC
  Filled 2015-03-08: qty 20

## 2015-03-08 MED ORDER — METOPROLOL TARTRATE 1 MG/ML IV SOLN: 2.5000 mg | INTRAVENOUS | Status: DC | PRN

## 2015-03-08 MED ORDER — ASPIRIN EC 325 MG PO TBEC
325.0000 mg | DELAYED_RELEASE_TABLET | Freq: Every day | ORAL | Status: DC
  Administered 2015-03-09 – 2015-03-10 (×2): 325 mg via ORAL
  Filled 2015-03-08 (×3): qty 1

## 2015-03-08 MED ORDER — HEMOSTATIC AGENTS (NO CHARGE) OPTIME
TOPICAL | Status: DC | PRN
  Administered 2015-03-08 (×2): 1 via TOPICAL

## 2015-03-08 MED ORDER — MILRINONE IN DEXTROSE 20 MG/100ML IV SOLN
0.1250 ug/kg/min | INTRAVENOUS | Status: AC
  Administered 2015-03-08: .3 ug/kg/min via INTRAVENOUS
  Filled 2015-03-08: qty 100

## 2015-03-08 MED ORDER — VANCOMYCIN HCL IN DEXTROSE 1-5 GM/200ML-% IV SOLN
1000.0000 mg | Freq: Two times a day (BID) | INTRAVENOUS | Status: AC
  Administered 2015-03-08 – 2015-03-09 (×3): 1000 mg via INTRAVENOUS
  Filled 2015-03-08 (×4): qty 200

## 2015-03-08 MED ORDER — CHLORHEXIDINE GLUCONATE 0.12 % MT SOLN
15.0000 mL | OROMUCOSAL | Status: AC
  Administered 2015-03-08: 15 mL via OROMUCOSAL
  Filled 2015-03-08: qty 15

## 2015-03-08 MED ORDER — FAMOTIDINE IN NACL 20-0.9 MG/50ML-% IV SOLN
20.0000 mg | Freq: Two times a day (BID) | INTRAVENOUS | Status: AC
  Administered 2015-03-08: 20 mg via INTRAVENOUS

## 2015-03-08 MED ORDER — SODIUM CHLORIDE 0.9 % IV SOLN: 12.5000 mg | Freq: Once | INTRAVENOUS | Status: DC

## 2015-03-08 MED ORDER — VANCOMYCIN HCL IN DEXTROSE 1-5 GM/200ML-% IV SOLN
1000.0000 mg | Freq: Once | INTRAVENOUS | Status: DC
  Filled 2015-03-08: qty 200

## 2015-03-08 MED ORDER — MIDAZOLAM HCL 10 MG/2ML IJ SOLN
INTRAMUSCULAR | Status: AC
  Filled 2015-03-08: qty 2

## 2015-03-08 MED ORDER — FENTANYL CITRATE (PF) 250 MCG/5ML IJ SOLN
INTRAMUSCULAR | Status: AC
  Filled 2015-03-08: qty 30

## 2015-03-08 MED ORDER — OXYCODONE HCL 5 MG PO TABS
5.0000 mg | ORAL_TABLET | ORAL | Status: DC | PRN
  Administered 2015-03-09: 10 mg via ORAL
  Administered 2015-03-09: 5 mg via ORAL
  Administered 2015-03-09 – 2015-03-10 (×3): 10 mg via ORAL
  Filled 2015-03-08 (×4): qty 2
  Filled 2015-03-08: qty 1

## 2015-03-08 MED ORDER — DOCUSATE SODIUM 100 MG PO CAPS
200.0000 mg | ORAL_CAPSULE | Freq: Every day | ORAL | Status: DC
  Administered 2015-03-09 – 2015-03-10 (×2): 200 mg via ORAL
  Filled 2015-03-08 (×2): qty 2

## 2015-03-08 MED ORDER — ACETAMINOPHEN 500 MG PO TABS
1000.0000 mg | ORAL_TABLET | Freq: Four times a day (QID) | ORAL | Status: DC
  Administered 2015-03-09 – 2015-03-10 (×5): 1000 mg via ORAL
  Filled 2015-03-08 (×10): qty 2

## 2015-03-08 MED ORDER — NITROGLYCERIN IN D5W 200-5 MCG/ML-% IV SOLN: 0.0000 ug/min | INTRAVENOUS | Status: DC

## 2015-03-08 MED ORDER — MILRINONE IN DEXTROSE 20 MG/100ML IV SOLN: 0.1250 ug/kg/min | INTRAVENOUS | Status: DC

## 2015-03-08 MED ORDER — PROTAMINE SULFATE 10 MG/ML IV SOLN
INTRAVENOUS | Status: DC | PRN
  Administered 2015-03-08: 200 mg via INTRAVENOUS

## 2015-03-08 MED ORDER — BUDESONIDE-FORMOTEROL FUMARATE 160-4.5 MCG/ACT IN AERO
2.0000 | INHALATION_SPRAY | Freq: Two times a day (BID) | RESPIRATORY_TRACT | Status: DC
Start: 2015-03-08 — End: 2015-03-13
  Administered 2015-03-09 – 2015-03-13 (×7): 2 via RESPIRATORY_TRACT
  Filled 2015-03-08 (×2): qty 6

## 2015-03-08 MED ORDER — ACETAMINOPHEN 160 MG/5ML PO SOLN: 650.0000 mg | Freq: Once | ORAL | Status: AC

## 2015-03-08 MED ORDER — MORPHINE SULFATE (PF) 2 MG/ML IV SOLN
2.0000 mg | INTRAVENOUS | Status: DC | PRN
  Administered 2015-03-08 – 2015-03-09 (×4): 2 mg via INTRAVENOUS
  Administered 2015-03-09: 4 mg via INTRAVENOUS
  Administered 2015-03-10 (×2): 2 mg via INTRAVENOUS
  Filled 2015-03-08 (×2): qty 2
  Filled 2015-03-08 (×7): qty 1

## 2015-03-08 MED ORDER — LEVALBUTEROL HCL 1.25 MG/0.5ML IN NEBU: 1.2500 mg | INHALATION_SOLUTION | Freq: Four times a day (QID) | RESPIRATORY_TRACT | Status: DC

## 2015-03-08 MED ORDER — LACTATED RINGERS IV SOLN: 500.0000 mL | Freq: Once | INTRAVENOUS | Status: DC | PRN

## 2015-03-08 MED ORDER — MIDAZOLAM HCL 5 MG/5ML IJ SOLN
INTRAMUSCULAR | Status: DC | PRN
  Administered 2015-03-08: 2 mg via INTRAVENOUS
  Administered 2015-03-08: 5 mg via INTRAVENOUS

## 2015-03-08 MED ORDER — ASPIRIN 81 MG PO CHEW: 324.0000 mg | CHEWABLE_TABLET | Freq: Every day | ORAL | Status: DC

## 2015-03-08 MED ORDER — SUCCINYLCHOLINE CHLORIDE 20 MG/ML IJ SOLN
INTRAMUSCULAR | Status: AC
  Filled 2015-03-08: qty 1

## 2015-03-08 MED ORDER — SODIUM CHLORIDE 0.9 % IJ SOLN
3.0000 mL | Freq: Two times a day (BID) | INTRAMUSCULAR | Status: DC
  Administered 2015-03-09 – 2015-03-10 (×3): 3 mL via INTRAVENOUS

## 2015-03-08 MED ORDER — FENTANYL CITRATE (PF) 100 MCG/2ML IJ SOLN
INTRAMUSCULAR | Status: DC | PRN
  Administered 2015-03-08 (×2): 250 ug via INTRAVENOUS
  Administered 2015-03-08: 200 ug via INTRAVENOUS
  Administered 2015-03-08: 300 ug via INTRAVENOUS
  Administered 2015-03-08: 250 ug via INTRAVENOUS

## 2015-03-08 MED ORDER — PROPOFOL 10 MG/ML IV BOLUS
INTRAVENOUS | Status: AC
  Filled 2015-03-08: qty 20

## 2015-03-08 MED ORDER — DOPAMINE-DEXTROSE 3.2-5 MG/ML-% IV SOLN
0.0000 ug/kg/min | INTRAVENOUS | Status: DC
Start: 2015-03-08 — End: 2015-03-10

## 2015-03-08 MED ORDER — ARTIFICIAL TEARS OP OINT
TOPICAL_OINTMENT | OPHTHALMIC | Status: DC | PRN
  Administered 2015-03-08: 1 via OPHTHALMIC

## 2015-03-08 MED ORDER — DOPAMINE-DEXTROSE 3.2-5 MG/ML-% IV SOLN: 2.5000 ug/kg/min | INTRAVENOUS | Status: DC

## 2015-03-08 MED ORDER — METOPROLOL TARTRATE 12.5 MG HALF TABLET
12.5000 mg | ORAL_TABLET | Freq: Two times a day (BID) | ORAL | Status: DC
  Filled 2015-03-08 (×5): qty 1

## 2015-03-08 MED ORDER — HEPARIN SODIUM (PORCINE) 1000 UNIT/ML IJ SOLN
INTRAMUSCULAR | Status: DC | PRN
  Administered 2015-03-08: 3000 [IU] via INTRAVENOUS
  Administered 2015-03-08: 20000 [IU] via INTRAVENOUS

## 2015-03-08 MED ORDER — ROCURONIUM BROMIDE 100 MG/10ML IV SOLN
INTRAVENOUS | Status: DC | PRN
  Administered 2015-03-08 (×2): 50 mg via INTRAVENOUS
  Administered 2015-03-08: 100 mg via INTRAVENOUS
  Administered 2015-03-08: 50 mg via INTRAVENOUS

## 2015-03-08 MED FILL — Lidocaine HCl IV Inj 20 MG/ML: INTRAVENOUS | Qty: 5 | Status: AC

## 2015-03-08 MED FILL — Electrolyte-R (PH 7.4) Solution: INTRAVENOUS | Qty: 6000 | Status: AC

## 2015-03-08 MED FILL — Potassium Chloride Inj 2 mEq/ML: INTRAVENOUS | Qty: 40 | Status: AC

## 2015-03-08 MED FILL — Albumin, Human Inj 5%: INTRAVENOUS | Qty: 250 | Status: AC

## 2015-03-08 MED FILL — Heparin Sodium (Porcine) Inj 1000 Unit/ML: INTRAMUSCULAR | Qty: 30 | Status: AC

## 2015-03-08 MED FILL — Magnesium Sulfate Inj 50%: INTRAMUSCULAR | Qty: 10 | Status: AC

## 2015-03-08 MED FILL — Mannitol IV Soln 20%: INTRAVENOUS | Qty: 500 | Status: AC

## 2015-03-08 MED FILL — Sodium Chloride IV Soln 0.9%: INTRAVENOUS | Qty: 2000 | Status: AC

## 2015-03-08 MED FILL — Heparin Sodium (Porcine) Inj 1000 Unit/ML: INTRAMUSCULAR | Qty: 10 | Status: AC

## 2015-03-08 MED FILL — Sodium Bicarbonate IV Soln 8.4%: INTRAVENOUS | Qty: 50 | Status: AC

## 2015-03-08 SURGICAL SUPPLY — 110 items
ADAPTER CARDIO PERF ANTE/RETRO (ADAPTER) ×4 IMPLANT
ADH SKN CLS APL DERMABOND .7 (GAUZE/BANDAGES/DRESSINGS) ×4
ADPR PRFSN 84XANTGRD RTRGD (ADAPTER) ×2
BAG DECANTER FOR FLEXI CONT (MISCELLANEOUS) ×4 IMPLANT
BANDAGE ELASTIC 4 VELCRO ST LF (GAUZE/BANDAGES/DRESSINGS) ×8 IMPLANT
BANDAGE ELASTIC 6 VELCRO ST LF (GAUZE/BANDAGES/DRESSINGS) ×8 IMPLANT
BASKET HEART  (ORDER IN 25'S) (MISCELLANEOUS) ×1
BASKET HEART (ORDER IN 25'S) (MISCELLANEOUS) ×1
BASKET HEART (ORDER IN 25S) (MISCELLANEOUS) ×2 IMPLANT
BLADE STERNUM SYSTEM 6 (BLADE) ×4 IMPLANT
BLADE SURG 12 STRL SS (BLADE) ×4 IMPLANT
BLADE SURG ROTATE 9660 (MISCELLANEOUS) IMPLANT
BNDG GAUZE ELAST 4 BULKY (GAUZE/BANDAGES/DRESSINGS) ×4 IMPLANT
CANISTER SUCTION 2500CC (MISCELLANEOUS) ×4 IMPLANT
CANNULA GUNDRY RCSP 15FR (MISCELLANEOUS) ×4 IMPLANT
CANNULA VESSEL 3MM BLUNT TIP (CANNULA) ×4 IMPLANT
CATH CPB KIT VANTRIGT (MISCELLANEOUS) ×4 IMPLANT
CATH ROBINSON RED A/P 18FR (CATHETERS) ×12 IMPLANT
CATH THORACIC 36FR RT ANG (CATHETERS) ×4 IMPLANT
CLIP FOGARTY SPRING 6M (CLIP) ×4 IMPLANT
CLIP RETRACTION 3.0MM CORONARY (MISCELLANEOUS) ×4 IMPLANT
CLIP TI WIDE RED SMALL 24 (CLIP) ×4 IMPLANT
COVER SURGICAL LIGHT HANDLE (MISCELLANEOUS) ×4 IMPLANT
CRADLE DONUT ADULT HEAD (MISCELLANEOUS) ×4 IMPLANT
DERMABOND ADVANCED (GAUZE/BANDAGES/DRESSINGS) ×4
DERMABOND ADVANCED .7 DNX12 (GAUZE/BANDAGES/DRESSINGS) ×4 IMPLANT
DRAIN CHANNEL 32F RND 10.7 FF (WOUND CARE) ×4 IMPLANT
DRAPE CARDIOVASCULAR INCISE (DRAPES) ×4
DRAPE SLUSH/WARMER DISC (DRAPES) ×4 IMPLANT
DRAPE SRG 135X102X78XABS (DRAPES) ×2 IMPLANT
DRSG AQUACEL AG ADV 3.5X14 (GAUZE/BANDAGES/DRESSINGS) ×4 IMPLANT
DRSG COVADERM 4X14 (GAUZE/BANDAGES/DRESSINGS) ×4 IMPLANT
DRSG KUZMA FLUFF (GAUZE/BANDAGES/DRESSINGS) ×8 IMPLANT
ELECT BLADE 4.0 EZ CLEAN MEGAD (MISCELLANEOUS) ×4
ELECT BLADE 6.5 EXT (BLADE) ×4 IMPLANT
ELECT CAUTERY BLADE 6.4 (BLADE) ×4 IMPLANT
ELECT REM PT RETURN 9FT ADLT (ELECTROSURGICAL) ×8
ELECTRODE BLDE 4.0 EZ CLN MEGD (MISCELLANEOUS) ×2 IMPLANT
ELECTRODE REM PT RTRN 9FT ADLT (ELECTROSURGICAL) ×4 IMPLANT
GAUZE SPONGE 4X4 12PLY STRL (GAUZE/BANDAGES/DRESSINGS) ×8 IMPLANT
GLOVE BIO SURGEON STRL SZ7.5 (GLOVE) ×12 IMPLANT
GOWN STRL REUS W/ TWL LRG LVL3 (GOWN DISPOSABLE) ×8 IMPLANT
GOWN STRL REUS W/TWL LRG LVL3 (GOWN DISPOSABLE) ×16
HEMOSTAT POWDER SURGIFOAM 1G (HEMOSTASIS) ×12 IMPLANT
HEMOSTAT SURGICEL 2X14 (HEMOSTASIS) ×4 IMPLANT
INSERT FOGARTY XLG (MISCELLANEOUS) IMPLANT
KIT BASIN OR (CUSTOM PROCEDURE TRAY) ×4 IMPLANT
KIT ROOM TURNOVER OR (KITS) ×4 IMPLANT
KIT SUCTION CATH 14FR (SUCTIONS) ×4 IMPLANT
KIT VASOVIEW W/TROCAR VH 2000 (KITS) ×4 IMPLANT
LEAD PACING MYOCARDI (MISCELLANEOUS) ×4 IMPLANT
MARKER GRAFT CORONARY BYPASS (MISCELLANEOUS) ×12 IMPLANT
NDL SUT 1 .5 CRC FRENCH EYE (NEEDLE) ×2 IMPLANT
NEEDLE FRENCH EYE (NEEDLE) ×3
NS IRRIG 1000ML POUR BTL (IV SOLUTION) ×20 IMPLANT
PACK OPEN HEART (CUSTOM PROCEDURE TRAY) ×4 IMPLANT
PAD ARMBOARD 7.5X6 YLW CONV (MISCELLANEOUS) ×8 IMPLANT
PAD ELECT DEFIB RADIOL ZOLL (MISCELLANEOUS) ×4 IMPLANT
PENCIL BUTTON HOLSTER BLD 10FT (ELECTRODE) ×4 IMPLANT
PUNCH AORTIC ROTATE  4.5MM 8IN (MISCELLANEOUS) ×4 IMPLANT
PUNCH AORTIC ROTATE 4.0MM (MISCELLANEOUS) IMPLANT
PUNCH AORTIC ROTATE 4.5MM 8IN (MISCELLANEOUS) IMPLANT
PUNCH AORTIC ROTATE 5MM 8IN (MISCELLANEOUS) IMPLANT
SET CARDIOPLEGIA MPS 5001102 (MISCELLANEOUS) ×4 IMPLANT
SPONGE GAUZE 4X4 12PLY STER LF (GAUZE/BANDAGES/DRESSINGS) ×12 IMPLANT
SPONGE LAP 18X18 X RAY DECT (DISPOSABLE) ×8 IMPLANT
SPONGE LAP 4X18 X RAY DECT (DISPOSABLE) ×4 IMPLANT
SURGIFLO W/THROMBIN 8M KIT (HEMOSTASIS) ×4 IMPLANT
SUT BONE WAX W31G (SUTURE) ×4 IMPLANT
SUT ETHIBOND 2 0 SH (SUTURE) ×16
SUT ETHIBOND 2 0 SH 36X2 (SUTURE) ×8 IMPLANT
SUT MNCRL AB 4-0 PS2 18 (SUTURE) ×12 IMPLANT
SUT PROLENE 3 0 SH DA (SUTURE) IMPLANT
SUT PROLENE 3 0 SH1 36 (SUTURE) IMPLANT
SUT PROLENE 4 0 RB 1 (SUTURE) ×8
SUT PROLENE 4 0 SH DA (SUTURE) ×4 IMPLANT
SUT PROLENE 4-0 RB1 .5 CRCL 36 (SUTURE) ×4 IMPLANT
SUT PROLENE 5 0 C 1 36 (SUTURE) ×8 IMPLANT
SUT PROLENE 6 0 C 1 30 (SUTURE) ×20 IMPLANT
SUT PROLENE 6 0 CC (SUTURE) ×12 IMPLANT
SUT PROLENE 8 0 BV175 6 (SUTURE) ×16 IMPLANT
SUT PROLENE BLUE 7 0 (SUTURE) ×44 IMPLANT
SUT SILK  1 MH (SUTURE) ×6
SUT SILK 1 MH (SUTURE) ×6 IMPLANT
SUT SILK 1 TIES 10X30 (SUTURE) ×4 IMPLANT
SUT SILK 2 0 SH CR/8 (SUTURE) ×12 IMPLANT
SUT SILK 2 0 TIES 10X30 (SUTURE) ×4 IMPLANT
SUT SILK 2 0 TIES 17X18 (SUTURE) ×4
SUT SILK 2-0 18XBRD TIE BLK (SUTURE) ×2 IMPLANT
SUT SILK 3 0 SH CR/8 (SUTURE) ×4 IMPLANT
SUT SILK 4 0 TIE 10X30 (SUTURE) ×8 IMPLANT
SUT STEEL 6MS V (SUTURE) ×8 IMPLANT
SUT STEEL SZ 6 DBL 3X14 BALL (SUTURE) ×4 IMPLANT
SUT TEM PAC WIRE 2 0 SH (SUTURE) ×16 IMPLANT
SUT VIC AB 1 CTX 36 (SUTURE) ×4
SUT VIC AB 1 CTX36XBRD ANBCTR (SUTURE) ×4 IMPLANT
SUT VIC AB 2-0 CT1 27 (SUTURE) ×16
SUT VIC AB 2-0 CT1 TAPERPNT 27 (SUTURE) ×8 IMPLANT
SUT VIC AB 2-0 CTX 27 (SUTURE) ×8 IMPLANT
SUT VIC AB 3-0 X1 27 (SUTURE) ×8 IMPLANT
SUTURE E-PAK OPEN HEART (SUTURE) ×4 IMPLANT
SYSTEM SAHARA CHEST DRAIN ATS (WOUND CARE) ×4 IMPLANT
TAPE CLOTH SURG 4X10 WHT LF (GAUZE/BANDAGES/DRESSINGS) ×12 IMPLANT
TAPE PAPER 2X10 WHT MICROPORE (GAUZE/BANDAGES/DRESSINGS) ×4 IMPLANT
TOWEL OR 17X24 6PK STRL BLUE (TOWEL DISPOSABLE) ×8 IMPLANT
TOWEL OR 17X26 10 PK STRL BLUE (TOWEL DISPOSABLE) ×8 IMPLANT
TRAY FOLEY IC TEMP SENS 16FR (CATHETERS) ×4 IMPLANT
TUBING INSUFFLATION (TUBING) ×4 IMPLANT
UNDERPAD 30X30 INCONTINENT (UNDERPADS AND DIAPERS) ×4 IMPLANT
WATER STERILE IRR 1000ML POUR (IV SOLUTION) ×8 IMPLANT

## 2015-03-08 NOTE — Anesthesia Procedure Notes (Signed)
Procedure Name: Intubation Date/Time: 03/08/2015 7:54 AM Performed by: Izora Gala Pre-anesthesia Checklist: Patient identified, Suction available, Emergency Drugs available, Patient being monitored and Timeout performed Patient Re-evaluated:Patient Re-evaluated prior to inductionOxygen Delivery Method: Circle system utilized Preoxygenation: Pre-oxygenation with 100% oxygen Intubation Type: IV induction Ventilation: Mask ventilation without difficulty and Oral airway inserted - appropriate to patient size Laryngoscope Size: Mac and 3 Grade View: Grade I Tube type: Oral Tube size: 7.5 mm Number of attempts: 1 Airway Equipment and Method: Stylet Placement Confirmation: positive ETCO2 and breath sounds checked- equal and bilateral Secured at: 22 cm Tube secured with: Tape Dental Injury: Teeth and Oropharynx as per pre-operative assessment

## 2015-03-08 NOTE — Progress Notes (Signed)
Beta blocker held due to heart rate of 58

## 2015-03-08 NOTE — Anesthesia Preprocedure Evaluation (Addendum)
Anesthesia Evaluation  Patient identified by MRN, date of birth, ID band Patient awake    Reviewed: Allergy & Precautions, NPO status , Patient's Chart, lab work & pertinent test results  History of Anesthesia Complications Negative for: history of anesthetic complications  Airway Mallampati: II  TM Distance: >3 FB Neck ROM: Full    Dental  (+) Poor Dentition, Missing   Pulmonary shortness of breath and with exertion, neg sleep apnea, COPD, neg recent URI, Current Smoker, neg PE   - rhonchi       Cardiovascular Exercise Tolerance: Poor + angina + CAD and + Past MI  + pacemaker  Rhythm:Regular Rate:Normal     Neuro/Psych negative neurological ROS  negative psych ROS   GI/Hepatic negative GI ROS, Neg liver ROS,   Endo/Other  negative endocrine ROS  Renal/GU negative Renal ROS  negative genitourinary   Musculoskeletal  (+) Arthritis , Osteoarthritis,    Abdominal Normal abdominal exam  (+)   Peds  Hematology   Anesthesia Other Findings Right arm weakness for the past 6 weeks.  Patient correlates this to his flu/Pneumonia shot.  Reproductive/Obstetrics                        Anesthesia Physical Anesthesia Plan  ASA: IV  Anesthesia Plan: General   Post-op Pain Management:    Induction: Intravenous  Airway Management Planned: Oral ETT  Additional Equipment: Arterial line, CVP, PA Cath, TEE and Ultrasound Guidance Line Placement  Intra-op Plan:   Post-operative Plan: Post-operative intubation/ventilation  Informed Consent: I have reviewed the patients History and Physical, chart, labs and discussed the procedure including the risks, benefits and alternatives for the proposed anesthesia with the patient or authorized representative who has indicated his/her understanding and acceptance.   Dental advisory given  Plan Discussed with: CRNA  Anesthesia Plan Comments:         Anesthesia Quick Evaluation

## 2015-03-08 NOTE — Transfer of Care (Signed)
Immediate Anesthesia Transfer of Care Note  Patient: Sean Bridges  Procedure(s) Performed: Procedure(s): CORONARY ARTERY BYPASS GRAFTING (CABG) TIMES FOUR  WITH BILATERAL ENDOSCOPIC SAPHENOUS VEIN HARVEST (N/A) TRANSESOPHAGEAL ECHOCARDIOGRAM (TEE) (N/A)  Patient Location: SICU  Anesthesia Type:General  Level of Consciousness: Patient remains intubated per anesthesia plan  Airway & Oxygen Therapy: Patient remains intubated per anesthesia plan and Patient placed on Ventilator (see vital sign flow sheet for setting)  Post-op Assessment: Report given to RN  Post vital signs: Reviewed and stable  Last Vitals:  Filed Vitals:   03/08/15 0653  BP:   Pulse: 58  Temp:   Resp:     Complications: No apparent anesthesia complications

## 2015-03-08 NOTE — Significant Event (Signed)
1530--1700: Dr. Prescott Gum made aware of PA wave forms and numbers. MD adjusted swan line multiple times and gave order to not infuse via proximal ports or shoot cardiac output. MD made aware of patient having severe shivering. RN gave versed per MD VO to help with shivering. Shivering subsided after versed was given. New orders placed and implemented.

## 2015-03-08 NOTE — Significant Event (Signed)
Unable to pull blood for carboxyhemoglobin via the PA line. Dr. Prescott Gum made aware of this. MD gave order to pull swan ganz.

## 2015-03-08 NOTE — Progress Notes (Signed)
Echocardiogram Echocardiogram Transesophageal has been performed.  Joelene Millin 03/08/2015, 8:27 AM

## 2015-03-08 NOTE — Op Note (Signed)
NAMEMarland Kitchen  Sean Bridges, MINE NO.:  [id number]  MEDICAL RECORD NO.:  [id number]  LOCATION:  2S07C                        FACILITY:  [facility]  PHYSICIAN:  Ivin Poot, M.D.  DATE OF BIRTH:  02/08/1946  DATE OF PROCEDURE:  03/08/2015 DATE OF DISCHARGE:                              OPERATIVE REPORT   OPERATION: 1. Coronary artery bypass grafting x4 (left internal mammary artery to     distal left anterior descending, saphenous vein graft to posterior     descending, saphenous vein graft to ramus intermediate, saphenous     vein graft to obtuse marginal). 2. Endoscopic harvest of bilateral greater saphenous vein.  SURGEON:  Ivin Poot, M.D.  ASSISTANT:  Lars Pinks, PA-C.  ANESTHESIA:  General by Dr. Darral Dash.  PREOPERATIVE DIAGNOSIS:  Severe three-vessel coronary artery disease with unstable angina.  POSTOPERATIVE DIAGNOSIS:  Severe three-vessel coronary artery disease with unstable angina.  CLINICAL NOTE:  The patient is a 69 year old Caucasian male smoker who presents with history of increasing and progressive chest pain and shortness of breath with exertion and a positive stress test.  He underwent cardiac catheterization earlier this week, which demonstrated severe 3-vessel coronary disease with occlusion of the LAD, occlusion of the right coronary, and occlusion of the distal circumflex.  He had inferior wall hypokinesia with ejection fraction of 50%.  He was felt to be candidate for surgical coronary revascularization.  I examined the patient in the office and reviewed the results of the cardiac catheterization with the patient and his family.  I discussed the expected benefits of coronary artery bypass grafting for treatment of his severe three-vessel coronary artery disease.  I reviewed the major aspects of the operation with the patient and family including the use of general anesthesia and cardiopulmonary bypass, the location of the surgical  incisions, and the expected postoperative hospital recovery.  I discussed with the patient risks to him of coronary artery bypass grafting including risks of MI, stroke, bleeding, blood transfusion requirement, infection, postoperative lung problems including pleural effusion, and death.  After reviewing these issues, he demonstrated his understanding and agreed to proceed with surgery under what I felt was an informed consent.  OPERATIVE FINDINGS: 1. Poor distal targets in the chronically occluded vessels of the     right coronary artery and LAD. 2. Non-graftable diagonal due to heavily calcified disease in small     size. 3. No packed cells or transfusions required for this operation.  OPERATIVE PROCEDURE:  The patient was brought to the operating room and placed supine on the operating table where general anesthesia was induced under invasive hemodynamic monitoring.  A transesophageal echo probe was placed by the anesthesiologist without known complication. The proper time-out process was completed.  After being prepped and draped and time-out, the patient underwent a sternal incision as well as bilateral leg incisions for endoscopic vein harvest.  A sternotomy was performed.  The sternum was retracted.  Pericardium was opened.  The left chest was elevated with the sternal elevating retractor for harvest of the left internal mammary artery as a pedicle graft from its origin at the subclavian vessels.  It was a 1.5-mm vessel with  adequate flow. The sternal retractor was replaced and a pericardial cradle was created. Pursestrings were placed in the ascending aorta and right atrium.  After the heparin had been administered and ACT was documented as being therapeutic, the patient was cannulated and placed on cardiopulmonary bypass.  The coronaries were identified for grafting.  Prior to going on bypass, the vein was inspected and prepared and several small bleeding points were repaired  with transfixion sutures of 6-0 Prolene.  This was time-consuming, but provided adequate conduit.  The mammary artery and vein grafts were prepared for the distal anastomoses after the distal targets were identified.  Cardioplegia cannulas were placed both antegrade and retrograde cold blood cardioplegia and the patient was cooled to 32 degrees.  The aortic crossclamp was applied.  One liter of cold blood cardioplegia was delivered in split doses between the antegrade aortic and retrograde coronary sinus catheters.  There was good cardioplegic arrest and septal temperature dropped less than 12 degrees.  Cardioplegia was delivered every 20 minutes.  The distal coronary anastomoses were performed.  First distal anastomosis was to the posterior descending branch of the distal RCA. This had been chronically occluded.  This was a small vessel 1.2 mm.  A reverse saphenous vein was sewn end-to-side with running 8-0 Prolene and there was adequate flow through the graft.  Cardioplegia was redosed.  The second distal anastomosis was the OM branch of the left circumflex. This had an 80% stenosis.  It was intramyocardial.  It had a proximal stenosis and calcified plaque.  A reverse saphenous vein was sewn end-to- side with running 7-0 Prolene to this 1.5-mm vessel with good flow through the graft.  Cardioplegia was redosed.  The third distal anastomosis was to the ramus branch of the left coronary.  This was a 1.5-mm vessel and had a proximal 80% stenosis.  It was intramyocardial.  A reverse saphenous vein was sewn end-to-side with running 7-0 Prolene.  There was good flow through the graft. Cardioplegia was redosed.  The fourth distal anastomosis was the distal LAD.  It was chronically occluded proximally.  The left IMA pedicle was brought through an opening in the left lateral pericardium, was brought down onto the LAD and sewn end-to-side with running 8-0 Prolene.  There was good  flow through the anastomosis after briefly releasing the pedicle bulldog on the mammary artery.  The bulldog was reapplied and the pedicle secured to the epicardium with 6-0 Prolene.  Cardioplegia was redosed.  While the crossclamp was still in place, 3 proximal vein anastomoses were performed on the ascending aorta using a 4.5 mm punch running 6-0 Prolene.  Prior to tying down the final proximal anastomosis, air was vented from the coronaries with a dose of retrograde warm blood cardioplegia.  The crossclamp was removed.  The heart resumed a spontaneous rhythm.  The vein grafts were de-aired and opened.  Each had good flow and hemostasis was documented at the proximal and distal anastomoses.  Temporary pacing wires were applied and the patient was rewarmed.  The lungs were then reinflated and the ventilator was resumed.  The patient was placed on low-dose milrinone and dopamine and after adequate rewarming and reperfusion, he was weaned from cardiopulmonary bypass without difficulty.  Cardiac output was over 6 L/minute.  Echo showed preserved LV global function.  Protamine was administered without adverse reaction.  The cannulas were removed.  The mediastinum was irrigated.  The superior pericardial fat was closed over the aorta.  The 2 leg incisions  were closed in a standard fashion.  The anterior mediastinal and left pleural chest tubes were placed and brought out through separate incisions.  The sternum was closed with interrupted steel wire.  The pectoralis fascia was closed in running #1 Vicryl.  The subcutaneous and skin layers were closed in running Vicryl and sterile dressings were applied.  Total cardiopulmonary bypass time was 122 minutes.     Ivin Poot, M.D.     PV/MEDQ  D:  03/08/2015  T:  03/08/2015  Job:  [id number]  cc:   Ezzard Standing, M.D.

## 2015-03-08 NOTE — Procedures (Signed)
Extubation Procedure Note  Patient Details:   Name: Sean Bridges DOB: 04/06/1946 MRN: [id number]   Airway Documentation:  Airway 7.5 mm (Active)  Secured at (cm) 22 cm 03/08/2015  3:44 PM  Measured From Lips 03/08/2015  3:44 PM  Secured Location Right 03/08/2015  3:44 PM  Secured By Rana Snare Tape 03/08/2015  2:45 PM  Site Condition Dry 03/08/2015  3:44 PM    Evaluation  O2 sats: stable throughout Complications: No apparent complications Patient did tolerate procedure well. Bilateral Breath Sounds: Clear Suctioning: Airway Yes  Patient extubated without any complications and placed on 3LNC. Patient was able to speak and cough with no complications.  Blanchie Serve 03/08/2015, 6:54 PM

## 2015-03-08 NOTE — Brief Op Note (Signed)
03/08/2015  12:18 PM  PATIENT:  Ovid Curd  69 y.o. male  PRE-OPERATIVE DIAGNOSIS:  CAD  POST-OPERATIVE DIAGNOSIS:  CAD  PROCEDURE:  TRANSESOPHAGEAL ECHOCARDIOGRAM (TEE), MEDIAN STERNOTOMY for CORONARY ARTERY BYPASS GRAFTING (CABG) x 4 WITH BILATERAL ENDOSCOPIC SAPHENOUS VEIN HARVEST FROM RIGHT THIGH AND LOWER LEG and LEFT THIGHT  SURGEON:  Surgeon(s) and Role:    * Ivin Poot, MD - Primary  PHYSICIAN ASSISTANT: Lars Pinks PA-C  ANESTHESIA:   general  EBL:  Total I/O In: 1700 [I.V.:1700] Out: 1000 [Urine:1000]  BLOOD ADMINISTERED:One FFP and One PLTS  DRAINS: Chest tubes placed in the mediastinal and pleural spaces   COUNTS CORRECT:  YES  DICTATION: .Dragon Dictation  PLAN OF CARE: Admit to inpatient   PATIENT DISPOSITION:  ICU - intubated and hemodynamically stable.   Delay start of Pharmacological VTE agent (>24hrs) due to surgical blood loss or risk of bleeding: yes  BASELINE WEIGHT: 73 kg

## 2015-03-08 NOTE — Progress Notes (Signed)
Small red like rash noted to right groin. Pt states that was where he had the card cath.

## 2015-03-08 NOTE — Progress Notes (Signed)
Utilization Review Completed.  

## 2015-03-08 NOTE — OR Nursing (Signed)
Retractor-out call to SICU at 1307. Skin closure call to SICU at 1341.

## 2015-03-08 NOTE — Anesthesia Postprocedure Evaluation (Signed)
Anesthesia Post Note  Patient: Sean Bridges  Procedure(s) Performed: Procedure(s) (LRB): CORONARY ARTERY BYPASS GRAFTING (CABG) TIMES FOUR  WITH BILATERAL ENDOSCOPIC SAPHENOUS VEIN HARVEST (N/A) TRANSESOPHAGEAL ECHOCARDIOGRAM (TEE) (N/A)  Anesthesia type: General  Patient location: SICU  Post pain: Pain level controlled  Post assessment: Post-op Vital signs reviewed  Last Vitals: BP 90/53 mmHg  Pulse 81  Temp(Src) 35.7 C (Core (Comment))  Resp 12  Ht 5' 8.5" (1.74 m)  Wt 161 lb (73.029 kg)  BMI 24.12 kg/m2  SpO2 97%  Post vital signs: Reviewed  Level of consciousness: sedated/intubated  Complications: No apparent anesthesia complications

## 2015-03-08 NOTE — Progress Notes (Signed)
CT surgery p.m. Rounds  Status post CABG 4 History of COPD, active smoking Patient receiving nebulizers therapy Awake on ventilator Hemodynamics stable Elevated right heart pressures preoperatively, we'll leave on milrinone and low-dose dopamine until a.m.

## 2015-03-08 NOTE — Progress Notes (Signed)
The patient was examined and preop studies reviewed. There has been no change from the prior exam and the patient is ready for surgery.  plan CABG on D Dutson today

## 2015-03-09 ENCOUNTER — Inpatient Hospital Stay (HOSPITAL_COMMUNITY): Payer: Medicare Other

## 2015-03-09 ENCOUNTER — Encounter (HOSPITAL_COMMUNITY): Payer: Self-pay | Admitting: Cardiothoracic Surgery

## 2015-03-09 DIAGNOSIS — L8990 Pressure ulcer of unspecified site, unspecified stage: Secondary | ICD-10-CM | POA: Insufficient documentation

## 2015-03-09 LAB — CBC
HEMATOCRIT: 33 % — AB (ref 39.0–52.0)
HEMATOCRIT: 33.7 % — AB (ref 39.0–52.0)
HEMOGLOBIN: 10.8 g/dL — AB (ref 13.0–17.0)
HEMOGLOBIN: 11.2 g/dL — AB (ref 13.0–17.0)
MCH: 30.3 pg (ref 26.0–34.0)
MCH: 30.6 pg (ref 26.0–34.0)
MCHC: 32.7 g/dL (ref 30.0–36.0)
MCHC: 33.2 g/dL (ref 30.0–36.0)
MCV: 92.4 fL (ref 78.0–100.0)
MCV: 92.1 fL (ref 78.0–100.0)
Platelets: 211 10*3/uL (ref 150–400)
Platelets: 187 10*3/uL (ref 150–400)
RBC: 3.57 MIL/uL — ABNORMAL LOW (ref 4.22–5.81)
RBC: 3.66 MIL/uL — AB (ref 4.22–5.81)
RDW: 13.2 % (ref 11.5–15.5)
RDW: 13.3 % (ref 11.5–15.5)
WBC: 13.5 10*3/uL — AB (ref 4.0–10.5)
WBC: 13.2 10*3/uL — AB (ref 4.0–10.5)

## 2015-03-09 LAB — GLUCOSE, CAPILLARY
Glucose-Capillary: 97 mg/dL (ref 65–99)
Glucose-Capillary: 93 mg/dL (ref 65–99)
Glucose-Capillary: 129 mg/dL — ABNORMAL HIGH (ref 65–99)
Glucose-Capillary: 131 mg/dL — ABNORMAL HIGH (ref 65–99)
Glucose-Capillary: 123 mg/dL — ABNORMAL HIGH (ref 65–99)
Glucose-Capillary: 117 mg/dL — ABNORMAL HIGH (ref 65–99)
Glucose-Capillary: 150 mg/dL — ABNORMAL HIGH (ref 65–99)

## 2015-03-09 LAB — BASIC METABOLIC PANEL
ANION GAP: 5 (ref 5–15)
BUN: 7 mg/dL (ref 6–20)
CALCIUM: 8.2 mg/dL — AB (ref 8.9–10.3)
CHLORIDE: 105 mmol/L (ref 101–111)
CO2: 28 mmol/L (ref 22–32)
Creatinine, Ser: 0.74 mg/dL (ref 0.61–1.24)
GFR calc non Af Amer: >60 mL/min (ref >60)
GLUCOSE: 137 mg/dL — AB (ref 65–99)
POTASSIUM: 3.7 mmol/L (ref 3.5–5.1)
Sodium: 138 mmol/L (ref 135–145)

## 2015-03-09 LAB — PREPARE PLATELET PHERESIS: Unit division: 0

## 2015-03-09 LAB — POCT I-STAT 3, ART BLOOD GAS (G3+)
Acid-base deficit: 2 mmol/L (ref 0.0–2.0)
Bicarbonate: 24 mEq/L (ref 20.0–24.0)
O2 Saturation: 98 %
Patient temperature: 98.6
TCO2: 25 mmol/L (ref 0–100)
pCO2 arterial: 45.5 mmHg — ABNORMAL HIGH (ref 35.0–45.0)
pH, Arterial: 7.33 — ABNORMAL LOW (ref 7.350–7.450)
pO2, Arterial: 106 mmHg — ABNORMAL HIGH (ref 80.0–100.0)

## 2015-03-09 LAB — MAGNESIUM
MAGNESIUM: 2.1 mg/dL (ref 1.7–2.4)
Magnesium: 2.2 mg/dL (ref 1.7–2.4)

## 2015-03-09 LAB — PREPARE FRESH FROZEN PLASMA: Unit division: 0

## 2015-03-09 LAB — POCT I-STAT, CHEM 8
BUN: 8 mg/dL (ref 6–20)
Calcium, Ion: 1.21 mmol/L (ref 1.13–1.30)
Chloride: 98 mmol/L — ABNORMAL LOW (ref 101–111)
Creatinine, Ser: 0.7 mg/dL (ref 0.61–1.24)
Glucose, Bld: 140 mg/dL — ABNORMAL HIGH (ref 65–99)
HCT: 36 % — ABNORMAL LOW (ref 39.0–52.0)
Hemoglobin: 12.2 g/dL — ABNORMAL LOW (ref 13.0–17.0)
Potassium: 4 mmol/L (ref 3.5–5.1)
Sodium: 137 mmol/L (ref 135–145)
TCO2: 26 mmol/L (ref 0–100)

## 2015-03-09 LAB — TYPE AND SCREEN
ABO/RH(D): A POS
Antibody Screen: NEGATIVE

## 2015-03-09 LAB — CREATININE, SERUM: Creatinine, Ser: 0.81 mg/dL (ref 0.61–1.24)

## 2015-03-09 MED ORDER — INSULIN ASPART 100 UNIT/ML ~~LOC~~ SOLN
0.0000 [IU] | SUBCUTANEOUS | Status: DC
  Administered 2015-03-09: 2 [IU] via SUBCUTANEOUS

## 2015-03-09 MED ORDER — POTASSIUM CHLORIDE 10 MEQ/50ML IV SOLN
10.0000 meq | INTRAVENOUS | Status: AC | PRN
  Administered 2015-03-09 (×3): 10 meq via INTRAVENOUS
  Filled 2015-03-09 (×4): qty 50

## 2015-03-09 MED ORDER — METOCLOPRAMIDE HCL 5 MG/ML IJ SOLN
10.0000 mg | Freq: Four times a day (QID) | INTRAMUSCULAR | Status: DC
  Administered 2015-03-09 – 2015-03-10 (×5): 10 mg via INTRAVENOUS
  Filled 2015-03-09 (×9): qty 2

## 2015-03-09 MED ORDER — INSULIN ASPART 100 UNIT/ML ~~LOC~~ SOLN
0.0000 [IU] | SUBCUTANEOUS | Status: DC
  Administered 2015-03-09 – 2015-03-10 (×5): 2 [IU] via SUBCUTANEOUS

## 2015-03-09 NOTE — Progress Notes (Signed)
1 Day Post-Op Procedure(s) (LRB): CORONARY ARTERY BYPASS GRAFTING (CABG) TIMES FOUR  WITH BILATERAL ENDOSCOPIC SAPHENOUS VEIN HARVEST (N/A) TRANSESOPHAGEAL ECHOCARDIOGRAM (TEE) (N/A) Subjective: Extubated, stable with some nausea- cont reglan Central iv access issues with sheath in Left  IJ nsr Objective: Vital signs in last 24 hours: Temp:  [96.3 F (35.7 C)-100.2 F (37.9 C)] 98.9 F (37.2 C) (11/18 0400) Pulse Rate:  [74-93] 78 (11/18 0800) Cardiac Rhythm:  [-] Normal sinus rhythm (11/18 0400) Resp:  [8-22] 22 (11/18 0800) BP: (82-112)/(43-65) 105/53 mmHg (11/18 0700) SpO2:  [95 %-100 %] 100 % (11/18 0800) Arterial Line BP: (78-150)/(35-79) 150/55 mmHg (11/18 0800) FiO2 (%):  [50 %] 50 % (11/17 1845) Weight:  [168 lb 10.4 oz (76.5 kg)] 168 lb 10.4 oz (76.5 kg) (11/18 0615)  Hemodynamic parameters for last 24 hours: PAP: (18-62)/(15-59) 30/27 mmHg CO:  [5.2 L/min-6.3 L/min] 5.2 L/min CI:  [2.8 L/min/m2-3.4 L/min/m2] 2.8 L/min/m2  Intake/Output from previous day: 11/17 0701 - 11/18 0700 In: 6992.8 [I.V.:3921.8; Blood:1031; NG/GT:30; IV Piggyback:2010] Out: 9460 [Urine:7135; Emesis/NG output:100; Blood:1625; Chest Tube:600] Intake/Output this shift: Total I/O In: 100 [IV Piggyback:100] Out: 95 [Urine:35; Chest Tube:60]  Neuro intact No air leak 550 cc chest tube drainage  Lab Results:  Recent Labs  03/08/15 2015 03/08/15 2027 03/09/15 0431  WBC 12.1*  --  13.5*  HGB 10.8* 10.9* 10.8*  HCT 32.9* 32.0* 33.0*  PLT 190  --  211   BMET:  Recent Labs  03/07/15 0837  03/08/15 2027 03/09/15 0431  NA 137  < > 140 138  K 4.6  < > 4.3 3.7  CL 104  < > 103 105  CO2 24  --   --  28  GLUCOSE 123*  < > 187* 137*  BUN 11  < > 7 7  CREATININE 0.91  < > 0.70 0.74  CALCIUM 9.1  --   --  8.2*  < > = values in this interval not displayed.  PT/INR:  Recent Labs  03/08/15 1440  LABPROT 19.7*  INR 1.67*   ABG    Component Value Date/Time   PHART 7.312* 03/08/2015  2023   HCO3 26.1* 03/08/2015 2023   TCO2 25 03/08/2015 2027   ACIDBASEDEF 1.0 03/08/2015 2023   O2SAT 96.0 03/08/2015 2023   CBG (last 3)   Recent Labs  03/09/15 0102 03/09/15 0152 03/09/15 0427  GLUCAP 97 93 129*    Assessment/Plan: S/P Procedure(s) (LRB): CORONARY ARTERY BYPASS GRAFTING (CABG) TIMES FOUR  WITH BILATERAL ENDOSCOPIC SAPHENOUS VEIN HARVEST (N/A) TRANSESOPHAGEAL ECHOCARDIOGRAM (TEE) (N/A) Wean pressors and A- pace if HR < 80 sustained Leave chest tubes but OOB to chair    LOS: 1 day    [name] 03/09/2015

## 2015-03-09 NOTE — Plan of Care (Signed)
SCD's not on d/t bilateral leg harvests. Dr. Darcey Nora aware.

## 2015-03-09 NOTE — Progress Notes (Signed)
Patient ID: Sean Bridges, male   DOB: 23-Nov-1945, 69 y.o.   MRN: [id number]  SICU Evening Rounds:  Hemodynamically stable off vasopressors.  CT output decreasing. Probably remove in the AM.  UO ok

## 2015-03-10 ENCOUNTER — Inpatient Hospital Stay (HOSPITAL_COMMUNITY): Payer: Medicare Other

## 2015-03-10 LAB — GLUCOSE, CAPILLARY
Glucose-Capillary: 111 mg/dL — ABNORMAL HIGH (ref 65–99)
Glucose-Capillary: 119 mg/dL — ABNORMAL HIGH (ref 65–99)
Glucose-Capillary: 122 mg/dL — ABNORMAL HIGH (ref 65–99)
Glucose-Capillary: 124 mg/dL — ABNORMAL HIGH (ref 65–99)
Glucose-Capillary: 129 mg/dL — ABNORMAL HIGH (ref 65–99)
Glucose-Capillary: 144 mg/dL — ABNORMAL HIGH (ref 65–99)

## 2015-03-10 LAB — CBC
HCT: 31.8 % — ABNORMAL LOW (ref 39.0–52.0)
Hemoglobin: 10.3 g/dL — ABNORMAL LOW (ref 13.0–17.0)
MCH: 30.2 pg (ref 26.0–34.0)
MCHC: 32.4 g/dL (ref 30.0–36.0)
MCV: 93.3 fL (ref 78.0–100.0)
Platelets: 172 10*3/uL (ref 150–400)
RBC: 3.41 MIL/uL — ABNORMAL LOW (ref 4.22–5.81)
RDW: 13.5 % (ref 11.5–15.5)
WBC: 10.1 10*3/uL (ref 4.0–10.5)

## 2015-03-10 LAB — BASIC METABOLIC PANEL
Anion gap: 5 (ref 5–15)
BUN: 7 mg/dL (ref 6–20)
CO2: 30 mmol/L (ref 22–32)
Calcium: 8.4 mg/dL — ABNORMAL LOW (ref 8.9–10.3)
Chloride: 100 mmol/L — ABNORMAL LOW (ref 101–111)
Creatinine, Ser: 0.8 mg/dL (ref 0.61–1.24)
GFR calc Af Amer: >60 mL/min (ref >60)
GFR calc non Af Amer: >60 mL/min (ref >60)
Glucose, Bld: 138 mg/dL — ABNORMAL HIGH (ref 65–99)
Potassium: 3.9 mmol/L (ref 3.5–5.1)
Sodium: 135 mmol/L (ref 135–145)

## 2015-03-10 MED ORDER — METOPROLOL TARTRATE 12.5 MG HALF TABLET
12.5000 mg | ORAL_TABLET | Freq: Two times a day (BID) | ORAL | Status: DC
  Administered 2015-03-10 – 2015-03-13 (×6): 12.5 mg via ORAL
  Filled 2015-03-10 (×6): qty 1

## 2015-03-10 MED ORDER — SODIUM CHLORIDE 0.9 % IJ SOLN
3.0000 mL | Freq: Two times a day (BID) | INTRAMUSCULAR | Status: DC
  Administered 2015-03-10 – 2015-03-12 (×6): 3 mL via INTRAVENOUS

## 2015-03-10 MED ORDER — FAMOTIDINE 20 MG PO TABS
20.0000 mg | ORAL_TABLET | Freq: Two times a day (BID) | ORAL | Status: DC
  Administered 2015-03-10 – 2015-03-13 (×6): 20 mg via ORAL
  Filled 2015-03-10 (×6): qty 1

## 2015-03-10 MED ORDER — ONDANSETRON HCL 4 MG/2ML IJ SOLN: 4.0000 mg | Freq: Four times a day (QID) | INTRAMUSCULAR | Status: DC | PRN

## 2015-03-10 MED ORDER — LEVALBUTEROL HCL 1.25 MG/0.5ML IN NEBU: 1.2500 mg | INHALATION_SOLUTION | Freq: Four times a day (QID) | RESPIRATORY_TRACT | Status: DC | PRN

## 2015-03-10 MED ORDER — INSULIN ASPART 100 UNIT/ML ~~LOC~~ SOLN
0.0000 [IU] | Freq: Three times a day (TID) | SUBCUTANEOUS | Status: DC
  Administered 2015-03-10 – 2015-03-11 (×4): 2 [IU] via SUBCUTANEOUS

## 2015-03-10 MED ORDER — ONDANSETRON HCL 4 MG PO TABS
4.0000 mg | ORAL_TABLET | Freq: Four times a day (QID) | ORAL | Status: DC | PRN
Start: 2015-03-10 — End: 2015-03-13

## 2015-03-10 MED ORDER — ACETAMINOPHEN 325 MG PO TABS: 650.0000 mg | ORAL_TABLET | Freq: Four times a day (QID) | ORAL | Status: DC | PRN

## 2015-03-10 MED ORDER — POTASSIUM CHLORIDE CRYS ER 20 MEQ PO TBCR
20.0000 meq | EXTENDED_RELEASE_TABLET | Freq: Every day | ORAL | Status: DC
  Administered 2015-03-11 – 2015-03-12 (×2): 20 meq via ORAL
  Filled 2015-03-10 (×2): qty 1

## 2015-03-10 MED ORDER — MOVING RIGHT ALONG BOOK
Freq: Once | Status: AC
  Administered 2015-03-10: 15:00:00
  Filled 2015-03-10: qty 1

## 2015-03-10 MED ORDER — SODIUM CHLORIDE 0.9 % IJ SOLN: 3.0000 mL | INTRAMUSCULAR | Status: DC | PRN

## 2015-03-10 MED ORDER — OXYCODONE HCL 5 MG PO TABS
5.0000 mg | ORAL_TABLET | ORAL | Status: DC | PRN
  Administered 2015-03-10: 5 mg via ORAL
  Administered 2015-03-10 – 2015-03-12 (×5): 10 mg via ORAL
  Filled 2015-03-10 (×6): qty 2

## 2015-03-10 MED ORDER — ASPIRIN EC 325 MG PO TBEC
325.0000 mg | DELAYED_RELEASE_TABLET | Freq: Every day | ORAL | Status: DC
  Administered 2015-03-11 – 2015-03-13 (×3): 325 mg via ORAL
  Filled 2015-03-10 (×3): qty 1

## 2015-03-10 MED ORDER — BISACODYL 10 MG RE SUPP: 10.0000 mg | Freq: Every day | RECTAL | Status: DC | PRN

## 2015-03-10 MED ORDER — TRAMADOL HCL 50 MG PO TABS
50.0000 mg | ORAL_TABLET | ORAL | Status: DC | PRN
  Administered 2015-03-12 – 2015-03-13 (×2): 100 mg via ORAL
  Filled 2015-03-10 (×2): qty 2

## 2015-03-10 MED ORDER — BISACODYL 5 MG PO TBEC: 10.0000 mg | DELAYED_RELEASE_TABLET | Freq: Every day | ORAL | Status: DC | PRN

## 2015-03-10 MED ORDER — FUROSEMIDE 40 MG PO TABS
40.0000 mg | ORAL_TABLET | Freq: Every day | ORAL | Status: DC
  Administered 2015-03-11 – 2015-03-12 (×2): 40 mg via ORAL
  Filled 2015-03-10 (×2): qty 1

## 2015-03-10 MED ORDER — DOCUSATE SODIUM 100 MG PO CAPS
200.0000 mg | ORAL_CAPSULE | Freq: Every day | ORAL | Status: DC
  Administered 2015-03-11 – 2015-03-13 (×3): 200 mg via ORAL
  Filled 2015-03-10 (×4): qty 2

## 2015-03-10 MED ORDER — SODIUM CHLORIDE 0.9 % IV SOLN: 250.0000 mL | INTRAVENOUS | Status: DC | PRN

## 2015-03-10 NOTE — Progress Notes (Signed)
2 Days Post-Op Procedure(s) (LRB): CORONARY ARTERY BYPASS GRAFTING (CABG) TIMES FOUR  WITH BILATERAL ENDOSCOPIC SAPHENOUS VEIN HARVEST (N/A) TRANSESOPHAGEAL ECHOCARDIOGRAM (TEE) (N/A) Subjective:  No complaints  Objective: Vital signs in last 24 hours: Temp:  [97.5 F (36.4 C)-98.8 F (37.1 C)] 97.6 F (36.4 C) (11/19 0729) Pulse Rate:  [81-109] 93 (11/19 1100) Cardiac Rhythm:  [-] Normal sinus rhythm (11/19 0800) Resp:  [10-25] 15 (11/19 1100) BP: (93-119)/(56-76) 107/64 mmHg (11/19 1100) SpO2:  [88 %-100 %] 98 % (11/19 1100) Arterial Line BP: (94-135)/(39-60) 120/50 mmHg (11/19 1100) Weight:  [76.3 kg (168 lb 3.4 oz)] 76.3 kg (168 lb 3.4 oz) (11/19 0500)  Hemodynamic parameters for last 24 hours:    Intake/Output from previous day: 11/18 0701 - 11/19 0700 In: 1663.5 [P.O.:400; I.V.:813.5; IV Piggyback:450] Out: [id number] [Urine:1325; Chest Tube:405] Intake/Output this shift: Total I/O In: 250 [P.O.:120; I.V.:80; IV Piggyback:50] Out: 50 [Urine:50]  General appearance: alert and cooperative Neurologic: intact Heart: regular rate and rhythm, S1, S2 normal, no murmur, click, rub or gallop Lungs: clear to auscultation bilaterally Extremities: extremities normal, atraumatic, no cyanosis or edema Wound: aquacel in place  Lab Results:  Recent Labs  03/09/15 1610 03/09/15 1614 03/10/15 0356  WBC 13.2*  --  10.1  HGB 11.2* 12.2* 10.3*  HCT 33.7* 36.0* 31.8*  PLT 187  --  172   BMET:  Recent Labs  03/09/15 0431  03/09/15 1614 03/10/15 0356  NA 138  --  137 135  K 3.7  --  4.0 3.9  CL 105  --  98* 100*  CO2 28  --   --  30  GLUCOSE 137*  --  140* 138*  BUN 7  --  8 7  CREATININE 0.74  < > 0.70 0.80  CALCIUM 8.2*  --   --  8.4*  < > = values in this interval not displayed.  PT/INR:  Recent Labs  03/08/15 1440  LABPROT 19.7*  INR 1.67*   ABG    Component Value Date/Time   PHART 7.312* 03/08/2015 2023   HCO3 26.1* 03/08/2015 2023   TCO2 26 03/09/2015 1614    ACIDBASEDEF 1.0 03/08/2015 2023   O2SAT 96.0 03/08/2015 2023   CBG (last 3)   Recent Labs  03/09/15 2351 03/10/15 0353 03/10/15 0727  GLUCAP 119* 124* 122*   CXR: ok  Assessment/Plan: S/P Procedure(s) (LRB): CORONARY ARTERY BYPASS GRAFTING (CABG) TIMES FOUR  WITH BILATERAL ENDOSCOPIC SAPHENOUS VEIN HARVEST (N/A) TRANSESOPHAGEAL ECHOCARDIOGRAM (TEE) (N/A)  He is doing well and remains hemodynamically stable in sinus rhythm. Mobilize Diuresis Diabetes control d/c tubes/lines Plan for transfer to step-down: see transfer orders   LOS: 2 days    Gaye Pollack 03/10/2015

## 2015-03-11 ENCOUNTER — Inpatient Hospital Stay (HOSPITAL_COMMUNITY): Payer: Medicare Other

## 2015-03-11 LAB — GLUCOSE, CAPILLARY
Glucose-Capillary: 130 mg/dL — ABNORMAL HIGH (ref 65–99)
Glucose-Capillary: 106 mg/dL — ABNORMAL HIGH (ref 65–99)
Glucose-Capillary: 123 mg/dL — ABNORMAL HIGH (ref 65–99)
Glucose-Capillary: 123 mg/dL — ABNORMAL HIGH (ref 65–99)

## 2015-03-11 MED ORDER — ATORVASTATIN CALCIUM 20 MG PO TABS
20.0000 mg | ORAL_TABLET | Freq: Every day | ORAL | Status: DC
  Administered 2015-03-11 – 2015-03-12 (×2): 20 mg via ORAL
  Filled 2015-03-11 (×2): qty 1

## 2015-03-11 NOTE — Progress Notes (Addendum)
[address]       [employer] [id number]             [phone]      3 Days Post-Op Procedure(s) (LRB): CORONARY ARTERY BYPASS GRAFTING (CABG) TIMES FOUR  WITH BILATERAL ENDOSCOPIC SAPHENOUS VEIN HARVEST (N/A) TRANSESOPHAGEAL ECHOCARDIOGRAM (TEE) (N/A) Subjective: Feels pretty well  Objective: Vital signs in last 24 hours: Temp:  [98.4 F (36.9 C)-99.3 F (37.4 C)] 99.3 F (37.4 C) (11/20 0509) Pulse Rate:  [80-109] 94 (11/20 0509) Cardiac Rhythm:  [-] Normal sinus rhythm (11/19 2000) Resp:  [10-20] 18 (11/20 0509) BP: (93-119)/(50-69) 106/52 mmHg (11/20 0509) SpO2:  [90 %-99 %] 96 % (11/19 2015) Arterial Line BP: (98-127)/(45-60) 120/50 mmHg (11/19 1100) Weight:  [166 lb 7.2 oz (75.5 kg)] 166 lb 7.2 oz (75.5 kg) (11/20 0546)  Hemodynamic parameters for last 24 hours:    Intake/Output from previous day: 11/19 0701 - 11/20 0700 In: 410 [P.O.:240; I.V.:120; IV Piggyback:50] Out: 825 [Urine:775; Chest Tube:50] Intake/Output this shift:    General appearance: alert, cooperative and no distress Heart: regular rate and rhythm Lungs: clear to auscultation bilaterally Abdomen: benign Extremities: no edema Wound: incis all healing well  Lab Results:  Recent Labs  03/09/15 1610 03/09/15 1614 03/10/15 0356  WBC 13.2*  --  10.1  HGB 11.2* 12.2* 10.3*  HCT 33.7* 36.0* 31.8*  PLT 187  --  172   BMET:  Recent Labs  03/09/15 0431  03/09/15 1614 03/10/15 0356  NA 138  --  137 135  K 3.7  --  4.0 3.9  CL 105  --  98* 100*  CO2 28  --   --  30  GLUCOSE 137*  --  140* 138*  BUN 7  --  8 7  CREATININE 0.74  < > 0.70 0.80  CALCIUM 8.2*  --   --  8.4*  < > = values in this interval not displayed.  PT/INR:  Recent Labs  03/08/15 1440  LABPROT 19.7*  INR 1.67*   ABG    Component Value Date/Time   PHART 7.312* 03/08/2015 2023   HCO3 26.1* 03/08/2015 2023   TCO2 26 03/09/2015 1614   ACIDBASEDEF 1.0 03/08/2015 2023   O2SAT 96.0 03/08/2015  2023   CBG (last 3)   Recent Labs  03/10/15 1621 03/10/15 2151 03/11/15 0615  GLUCAP 129* 144* 130*    Meds Scheduled Meds: . aspirin EC  325 mg Oral Daily  . budesonide-formoterol  2 puff Inhalation BID  . docusate sodium  200 mg Oral Daily  . famotidine  20 mg Oral BID  . furosemide  40 mg Oral Daily  . insulin aspart  0-24 Units Subcutaneous TID AC & HS  . metoprolol tartrate  12.5 mg Oral BID  . potassium chloride  20 mEq Oral Daily  . sodium chloride  3 mL Intravenous Q12H   Continuous Infusions:  PRN Meds:.sodium chloride, acetaminophen, bisacodyl **OR** bisacodyl, levalbuterol, ondansetron **OR** ondansetron (ZOFRAN) IV, oxyCODONE, sodium chloride, traMADol  Xrays Dg Chest Port 1 View  03/10/2015  CLINICAL DATA:  03-08-15 s/p CABG x 4 EXAM: PORTABLE CHEST 1 VIEW COMPARISON:  03/09/2015 and older exams. FINDINGS: Cardiac silhouette is normal in size.  No mediastinal widening. Lung volumes are relatively low. There is crowding of the bronchovascular structures in the lower lungs. There is also evidence of lung base atelectasis, but no convincing pneumonia and no pulmonary edema. No pneumothorax. Left chest tube and  left internal jugular introducer sheath are stable and well positioned. IMPRESSION: 1. No current evidence of pulmonary edema. 2. Mild basilar atelectasis. 3. No evidence of an operative complication. No pneumothorax or mediastinal widening. 4. Remaining support apparatus is well positioned. Electronically Signed   By: Lajean Manes M.D.   On: 03/10/2015 07:37    Assessment/Plan: S/P Procedure(s) (LRB): CORONARY ARTERY BYPASS GRAFTING (CABG) TIMES FOUR  WITH BILATERAL ENDOSCOPIC SAPHENOUS VEIN HARVEST (N/A) TRANSESOPHAGEAL ECHOCARDIOGRAM (TEE) (N/A)  1 conts with excellent progress 2 hemodyn stable in sinus rhythm/sinus tach, BP runs a bit low to add ace inhib at this time, will add statin 3 wean off O2 4 routine rehab 5 sugars well controlled, no new labs   LOS: 3 days    GOLD,WAYNE E 03/11/2015   Chart reviewed, patient examined, agree with above. He is feeling better. Has not walked yet today so encouraged to walk three times and use his IS.

## 2015-03-12 LAB — CBC
HCT: 35.7 % — ABNORMAL LOW (ref 39.0–52.0)
Hemoglobin: 11.6 g/dL — ABNORMAL LOW (ref 13.0–17.0)
MCH: 30.4 pg (ref 26.0–34.0)
MCHC: 32.5 g/dL (ref 30.0–36.0)
MCV: 93.7 fL (ref 78.0–100.0)
Platelets: 237 10*3/uL (ref 150–400)
RBC: 3.81 MIL/uL — ABNORMAL LOW (ref 4.22–5.81)
RDW: 13.4 % (ref 11.5–15.5)
WBC: 6 10*3/uL (ref 4.0–10.5)

## 2015-03-12 LAB — GLUCOSE, CAPILLARY: Glucose-Capillary: 96 mg/dL (ref 65–99)
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
## 2015-03-12 NOTE — Progress Notes (Signed)
CARDIAC REHAB PHASE I   PRE:  Rate/Rhythm: 86 SR    BP: sitting 108/70    SaO2: 96 2L  MODE:  Ambulation: 550 ft   POST:  Rate/Rhythm: 114 ST    BP: sitting 121/64     SaO2: 91 RA  Pt got out of bed independently although uses his arms at times and did not heed warnings. Steady walking, independent. Denied c/o. SAO2 91 RA after walk. Return to bed, continued to use arms at times to adjust himself. HR elevated with walking. Encouraged x2 more walks and IS use. Will ed in am with girlfriend. [id number]   Josephina Shih Pritchett CES, ACSM 03/12/2015 11:26 AM

## 2015-03-12 NOTE — Progress Notes (Signed)
Removed EPW. Wire tips intact. Patient tolerated well. VSS. Patient educated on bedrest for an hour with vitals Q15 minutes. Will continue to monitor.  Domingo Dimes RN

## 2015-03-12 NOTE — Discharge Instructions (Signed)
° ° °Activity: 1.May walk up steps °               2.No lifting more than ten pounds for four weeks.  °               3.No driving for four weeks. °               4.Stop any activity that causes chest pain, shortness of breath, dizziness, sweating or excessive weakness. °               5.Avoid straining. °               6.Continue with your breathing exercises daily. ° °Diet: Diabetic diet and Low fat, Low salt diet ° °Wound Care: May shower.  Clean wounds with mild soap and water daily. Contact the office at [phone] if any problems arise. ° °Coronary Artery Bypass Grafting, Care After °Refer to this sheet in the next few weeks. These instructions provide you with information on caring for yourself after your procedure. Your health care provider may also give you more specific instructions. Your treatment has been planned according to current medical practices, but problems sometimes occur. Call your health care provider if you have any problems or questions after your procedure. °WHAT TO EXPECT AFTER THE PROCEDURE °Recovery from surgery will be different for everyone. Some people feel well after 3 or 4 weeks, while for others it takes longer. After your procedure, it is typical to have the following: °· Nausea and a lack of appetite.   °· Constipation. °· Weakness and fatigue.   °· Depression or irritability.   °· Pain or discomfort at your incision site. °HOME CARE INSTRUCTIONS °· Take medicines only as directed by your health care provider. Do not stop taking medicines or start any new medicines without first checking with your health care provider. °· Take your pulse as directed by your health care provider. °· Perform deep breathing as directed by your health care provider. If you were given a device called an incentive spirometer, use it to practice deep breathing several times a day. Support your chest with a pillow or your arms when you take deep breaths or cough. °· Keep incision areas clean, dry, and  protected. Remove or change any bandages (dressings) only as directed by your health care provider. You may have skin adhesive strips over the incision areas. Do not take the strips off. They will fall off on their own. °· Check incision areas daily for any swelling, redness, or drainage. °· If incisions were made in your legs, do the following: °¨ Avoid crossing your legs.   °¨ Avoid sitting for long periods of time. Change positions every 30 minutes.   °¨ Elevate your legs when you are sitting. °· Wear compression stockings as directed by your health care provider. These stockings help keep blood clots from forming in your legs. °· Take showers once your health care provider approves. Until then, only take sponge baths. Pat incisions dry. Do not rub incisions with a washcloth or towel. Do not take baths, swim, or use a hot tub until your health care provider approves. °· Eat foods that are high in fiber, such as raw fruits and vegetables, whole grains, beans, and nuts. Meats should be lean cut. Avoid canned, processed, and fried foods. °· Drink enough fluid to keep your urine clear or pale yellow. °· Weigh yourself every day. This helps identify if you are retaining fluid that may make your heart   and lungs work harder. °· Rest and limit activity as directed by your health care provider. You may be instructed to: °¨ Stop any activity at once if you have chest pain, shortness of breath, irregular heartbeats, or dizziness. Get help right away if you have any of these symptoms. °¨ Move around frequently for short periods or take short walks as directed by your health care provider. Increase your activities gradually. You may need physical therapy or cardiac rehabilitation to help strengthen your muscles and build your endurance. °¨ Avoid lifting, pushing, or pulling anything heavier than 10 lb (4.5 kg) for at least 6 weeks after surgery. °· Do not drive until your health care provider approves.  °· Ask your health  care provider when you may return to work. °· Ask your health care provider when you may resume sexual activity. °· Keep all follow-up visits as directed by your health care provider. This is important. °SEEK MEDICAL CARE IF: °· You have swelling, redness, increasing pain, or drainage at the site of an incision. °· You have a fever. °· You have swelling in your ankles or legs. °· You have pain in your legs.   °· You gain 2 or more pounds (0.9 kg) a day. °· You are nauseous or vomit. °· You have diarrhea.  °SEEK IMMEDIATE MEDICAL CARE IF: °· You have chest pain that goes to your jaw or arms. °· You have shortness of breath.   °· You have a fast or irregular heartbeat.   °· You notice a "clicking" in your breastbone (sternum) when you move.   °· You have numbness or weakness in your arms or legs. °· You feel dizzy or light-headed.   °MAKE SURE YOU: °· Understand these instructions. °· Will watch your condition. °· Will get help right away if you are not doing well or get worse. °  °This information is not intended to replace advice given to you by your health care provider. Make sure you discuss any questions you have with your health care provider. °  °Document Released: 10/25/2004 Document Revised: 04/28/2014 Document Reviewed: 09/14/2012 °Elsevier Interactive Patient Education ©2016 [employer] ° °

## 2015-03-12 NOTE — Care Management Important Message (Signed)
Important Message  Patient Details  Name: Sean Bridges MRN: [id number] Date of Birth: 07/07/1945   Medicare Important Message Given:  Yes    [name] May 03/12/2015, 10:17 AM

## 2015-03-12 NOTE — Progress Notes (Addendum)
      [name]       [name]             [phone]        4 Days Post-Op Procedure(s) (LRB): CORONARY ARTERY BYPASS GRAFTING (CABG) TIMES FOUR  WITH BILATERAL ENDOSCOPIC SAPHENOUS VEIN HARVEST (N/A) TRANSESOPHAGEAL ECHOCARDIOGRAM (TEE) (N/A)  Subjective: Patient eating breakfast and he has no complaints.   Objective: Vital signs in last 24 hours: Temp:  [98.2 F (36.8 C)-99.6 F (37.6 C)] 98.2 F (36.8 C) (11/21 0444) Pulse Rate:  [81-97] 81 (11/21 0444) Cardiac Rhythm:  [-] Normal sinus rhythm (11/20 1950) Resp:  [18] 18 (11/21 0444) BP: (95-118)/(56-62) 118/62 mmHg (11/21 0444) SpO2:  [93 %-98 %] 98 % (11/21 0444) Weight:  [161 lb 14.4 oz (73.437 kg)] 161 lb 14.4 oz (73.437 kg) (11/21 0444)  Pre op weight 73 kg Current Weight  03/12/15 161 lb 14.4 oz (73.437 kg)      Intake/Output from previous day: 11/20 0701 - 11/21 0700 In: 480 [P.O.:480] Out: 400 [Urine:400]   Physical Exam:  Cardiovascular: RRR Pulmonary: Clear to auscultation bilaterally; no rales, wheezes, or rhonchi. Abdomen: Soft, non tender, bowel sounds present. Extremities: Trace bilateral lower extremity edema. Wounds: Clean and dry.  No erythema or signs of infection.  Lab Results: CBC: Recent Labs  03/09/15 1610 03/09/15 1614 03/10/15 0356  WBC 13.2*  --  10.1  HGB 11.2* 12.2* 10.3*  HCT 33.7* 36.0* 31.8*  PLT 187  --  172   BMET:  Recent Labs  03/09/15 1614 03/10/15 0356  NA 137 135  K 4.0 3.9  CL 98* 100*  CO2  --  30  GLUCOSE 140* 138*  BUN 8 7  CREATININE 0.70 0.80  CALCIUM  --  8.4*    PT/INR:  Lab Results  Component Value Date   INR 1.67* 03/08/2015   INR 1.06 03/07/2015   ABG:  INR: Will add last result for INR, ABG once components are confirmed Will add last 4 CBG results once components are confirmed  Assessment/Plan:  1. CV - SR with first degree heart block. On Lopressor 12.5 mg bid 2.  Pulmonary - On 2 liters via  [place]. Wean as tolerates. Encourage incentive spirometer 3. Volume Overload - On Lasix 40 mg daily 4.  Acute blood loss anemia - Last H and H 10.3 and 31.8. Per Dr. Prescott Gum, re check CBC in am 5. Low grade fever (up to 99.6) two times yesterday. Last WBC normal at 10,100. No wound infection. Likely atelectasis. 6. Remove EPW 7. CBGs 123/123/96. Pre op HGA1C 6.1. He is pre diabetic. Stop accu checks and SS 8. Likely home in am  Sean Bridges MPA-C 03/12/2015,7:22 AM  patient examined and medical record reviewed,agree with above note. [name] 03/12/2015

## 2015-03-12 NOTE — Progress Notes (Signed)
Ed completed at pts request with girlfriend. Girlfriend very receptive, pt somewhat receptive. Pt quit smoking 3 days prior to surgery. Plans to stay quit, resources given. Requests referral be sent to [facility]. [id number] Yves Dill CES, ACSM 3:02 PM 03/12/2015

## 2015-03-13 LAB — CBC
HCT: 32.4 % — ABNORMAL LOW (ref 39.0–52.0)
Hemoglobin: 10.5 g/dL — ABNORMAL LOW (ref 13.0–17.0)
MCH: 29.8 pg (ref 26.0–34.0)
MCHC: 32.4 g/dL (ref 30.0–36.0)
MCV: 92 fL (ref 78.0–100.0)
PLATELETS: 267 10*3/uL (ref 150–400)
RBC: 3.52 MIL/uL — AB (ref 4.22–5.81)
RDW: 13.2 % (ref 11.5–15.5)
WBC: 6 10*3/uL (ref 4.0–10.5)

## 2015-03-13 MED ORDER — METOPROLOL TARTRATE 25 MG PO TABS: 12.5000 mg | ORAL_TABLET | Freq: Two times a day (BID) | ORAL | Status: DC

## 2015-03-13 MED ORDER — BUDESONIDE-FORMOTEROL FUMARATE 160-4.5 MCG/ACT IN AERO: 2.0000 | INHALATION_SPRAY | Freq: Two times a day (BID) | RESPIRATORY_TRACT | Status: DC

## 2015-03-13 MED ORDER — ATORVASTATIN CALCIUM 20 MG PO TABS: 20.0000 mg | ORAL_TABLET | Freq: Every day | ORAL | Status: DC

## 2015-03-13 MED ORDER — ASPIRIN 325 MG PO TBEC: 325.0000 mg | DELAYED_RELEASE_TABLET | Freq: Every day | ORAL | Status: DC

## 2015-03-13 MED ORDER — OXYCODONE HCL 5 MG PO TABS: 5.0000 mg | ORAL_TABLET | ORAL | Status: DC | PRN

## 2015-03-13 NOTE — Care Management Note (Signed)
Case Management Note Marvetta Gibbons RN, BSN Unit 2W-Case Manager [phone]  Patient Details  Name: Sean Bridges MRN: [id number] Date of Birth: 02-25-1946  Subjective/Objective:     Pt admitted s/p CABG x4           Action/Plan: PTA pt lived at home with spouse- anticipate return home with no needs  Expected Discharge Date:      03/13/15            Expected Discharge Plan:  Home/Self Care  In-House Referral:     Discharge planning Services  CM Consult  Post Acute Care Choice:    Choice offered to:     DME Arranged:  N/A DME Agency:  NA  HH Arranged:  NA HH Agency:  NA  Status of Service:  Completed, signed off  Medicare Important Message Given:  Yes Date Medicare IM Given:    Medicare IM give by:    Date Additional Medicare IM Given:    Additional Medicare Important Message give by:     If discussed at [place] of Stay Meetings, dates discussed:  03/13/15  Additional Comments:  Dawayne Patricia, RN 03/13/2015, 10:50 AM

## 2015-03-13 NOTE — Progress Notes (Signed)
      [address]       [place] [address]             [phone]        5 Days Post-Op Procedure(s) (LRB): CORONARY ARTERY BYPASS GRAFTING (CABG) TIMES FOUR  WITH BILATERAL ENDOSCOPIC SAPHENOUS VEIN HARVEST (N/A) TRANSESOPHAGEAL ECHOCARDIOGRAM (TEE) (N/A)  Subjective: Patient has no complaints and wants to go home  Objective: Vital signs in last 24 hours: Temp:  [98.4 F (36.9 C)-98.8 F (37.1 C)] 98.4 F (36.9 C) (11/22 0501) Pulse Rate:  [70-92] 70 (11/22 0501) Cardiac Rhythm:  [-] Normal sinus rhythm (11/21 1901) Resp:  [18] 18 (11/22 0501) BP: (99-124)/(60-71) 109/63 mmHg (11/22 0501) SpO2:  [93 %-98 %] 94 % (11/22 0501) Weight:  [158 lb 15.2 oz (72.1 kg)] 158 lb 15.2 oz (72.1 kg) (11/22 0200)  Pre op weight 73 kg Current Weight  03/13/15 158 lb 15.2 oz (72.1 kg)      Intake/Output from previous day: 11/21 0701 - 11/22 0700 In: 603 [P.O.:600; I.V.:3] Out: 150 [Urine:150]   Physical Exam:  Cardiovascular: RRR Pulmonary: Clear to auscultation bilaterally; no rales, wheezes, or rhonchi. Abdomen: Soft, non tender, bowel sounds present. Extremities: No lower extremity edema. Wounds: Clean and dry.  No erythema or signs of infection.  Lab Results: CBC:  Recent Labs  03/12/15 0840 03/13/15 0314  WBC 6.0 6.0  HGB 11.6* 10.5*  HCT 35.7* 32.4*  PLT 237 267   BMET: No results for input(s): NA, K, CL, CO2, GLUCOSE, BUN, CREATININE, CALCIUM in the last 72 hours.  PT/INR:  Lab Results  Component Value Date   INR 1.67* 03/08/2015   INR 1.06 03/07/2015   ABG:  INR: Will add last result for INR, ABG once components are confirmed Will add last 4 CBG results once components are confirmed  Assessment/Plan:  1. CV - SR with first degree heart block. On Lopressor 12.5 mg bid. Will consider ACE as outpatient once BP more labile 2.  Pulmonary - On room air. Encourage incentive spirometer 3. Volume Overload - On Lasix 40 mg daily 4.  Acute  blood loss anemia - Last H and H stable at 10.5 and 32.4.  5 Discharge  [name],[name] 03/13/2015,7:31 AM

## 2015-03-13 NOTE — Progress Notes (Signed)
03/13/2015 9:26 AM Discharge AVS meds taken today and those due this evening reviewed.  Follow-up appointments and when to call md reviewed.  D/C IV and TELE.  Questions and concerns addressed.   D/C home per orders.Carney Corners

## 2015-03-13 NOTE — Progress Notes (Signed)
03/13/2015 8:55 AM Chest tube sutures removed per orders.  Pt tolerated well.  Steri strips and benzoin applied. Carney Corners

## 2015-03-28 ENCOUNTER — Other Ambulatory Visit: Payer: Self-pay | Admitting: *Deleted

## 2015-03-28 DIAGNOSIS — G8918 Other acute postprocedural pain: Secondary | ICD-10-CM

## 2015-03-28 MED ORDER — OXYCODONE HCL 5 MG PO TABS: 5.0000 mg | ORAL_TABLET | ORAL | Status: DC | PRN

## 2015-04-05 ENCOUNTER — Telehealth (HOSPITAL_COMMUNITY): Payer: Self-pay | Admitting: *Deleted

## 2015-04-05 NOTE — Telephone Encounter (Signed)
Received referral from MD.  Message left on answering machine to please contact cardiac rehab.  Contact number provided. Cherre Huger, BSN

## 2015-04-09 ENCOUNTER — Other Ambulatory Visit: Payer: Self-pay | Admitting: Physician Assistant

## 2015-04-18 ENCOUNTER — Encounter: Payer: Self-pay | Admitting: Cardiothoracic Surgery

## 2015-04-18 ENCOUNTER — Other Ambulatory Visit: Payer: Self-pay | Admitting: Cardiothoracic Surgery

## 2015-04-18 DIAGNOSIS — Z951 Presence of aortocoronary bypass graft: Secondary | ICD-10-CM

## 2015-04-19 ENCOUNTER — Encounter: Payer: Self-pay | Admitting: Cardiothoracic Surgery

## 2015-04-19 ENCOUNTER — Ambulatory Visit
Admission: RE | Admit: 2015-04-19 | Discharge: 2015-04-19 | Disposition: A | Discharge status: Home / Self Care | Payer: Medicare Other | Source: Ambulatory Visit | Attending: Cardiothoracic Surgery | Admitting: Cardiothoracic Surgery

## 2015-04-19 ENCOUNTER — Ambulatory Visit (INDEPENDENT_AMBULATORY_CARE_PROVIDER_SITE_OTHER): Payer: Self-pay | Admitting: Cardiothoracic Surgery

## 2015-04-19 VITALS — BP 125/72 | HR 56 | Resp 16 | Ht 68.0 in | Wt 160.0 lb

## 2015-04-19 DIAGNOSIS — I2510 Atherosclerotic heart disease of native coronary artery without angina pectoris: Secondary | ICD-10-CM

## 2015-04-19 DIAGNOSIS — Z951 Presence of aortocoronary bypass graft: Secondary | ICD-10-CM
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
## 2015-05-01 DIAGNOSIS — I2510 Atherosclerotic heart disease of native coronary artery without angina pectoris: Secondary | ICD-10-CM | POA: Diagnosis not present

## 2015-05-08 ENCOUNTER — Other Ambulatory Visit: Payer: Self-pay | Admitting: Physician Assistant

## 2015-05-10 DIAGNOSIS — R7301 Impaired fasting glucose: Secondary | ICD-10-CM | POA: Diagnosis not present

## 2015-05-10 DIAGNOSIS — E782 Mixed hyperlipidemia: Secondary | ICD-10-CM | POA: Diagnosis not present

## 2015-05-12 ENCOUNTER — Other Ambulatory Visit: Payer: Self-pay | Admitting: Physician Assistant

## 2015-05-28 DIAGNOSIS — R7301 Impaired fasting glucose: Secondary | ICD-10-CM | POA: Diagnosis not present

## 2015-05-28 DIAGNOSIS — I2510 Atherosclerotic heart disease of native coronary artery without angina pectoris: Secondary | ICD-10-CM | POA: Diagnosis not present

## 2015-05-28 DIAGNOSIS — E782 Mixed hyperlipidemia: Secondary | ICD-10-CM | POA: Diagnosis not present

## 2015-05-28 DIAGNOSIS — J449 Chronic obstructive pulmonary disease, unspecified: Secondary | ICD-10-CM | POA: Diagnosis not present

## 2015-06-07 ENCOUNTER — Encounter (HOSPITAL_COMMUNITY)
Admission: RE | Admit: 2015-06-07 | Discharge: 2015-06-07 | Disposition: A | Discharge status: Home / Self Care | Payer: Medicare Other | Source: Ambulatory Visit | Attending: Cardiology | Admitting: Cardiology

## 2015-06-07 ENCOUNTER — Ambulatory Visit (HOSPITAL_COMMUNITY): Payer: Medicare Other

## 2015-06-07 DIAGNOSIS — Z951 Presence of aortocoronary bypass graft: Secondary | ICD-10-CM | POA: Insufficient documentation

## 2015-06-11 ENCOUNTER — Encounter (HOSPITAL_COMMUNITY)
Admission: RE | Admit: 2015-06-11 | Discharge: 2015-06-11 | Disposition: A | Discharge status: Home / Self Care | Payer: Medicare Other | Source: Ambulatory Visit | Attending: Cardiology | Admitting: Cardiology

## 2015-06-11 DIAGNOSIS — Z951 Presence of aortocoronary bypass graft: Secondary | ICD-10-CM | POA: Diagnosis not present

## 2015-06-11 NOTE — Progress Notes (Signed)
Pt started cardiac rehab today.  Pt tolerated light exercise without difficulty. VSS, telemetry-Sr with no ectopy, asymptomatic.  Medication list reconciled.  Pt verbalized compliance with medications and denies barriers to compliance. PSYCHOSOCIAL ASSESSMENT:  PHQ-0.pt is quiet by nature and denies any depression. Pt exhibits positive coping skills, hopeful outlook with supportive family. Pt has returned to work without any issues.  Pt works in Press photographer. No psychosocial needs identified at this time, no psychosocial interventions necessary.    Pt enjoys going to the beach .   Pt cardiac rehab  goal for both short and long term is to increase upper body strength and endurance .  Pt encouraged to participate in consistent home exercise and education classes to increase ability to achieve these goals. Pt also encouraged to stop smoking. Pt has decreased his usage to 10-15 cigarettes daily.  Pt desires to quit smoking and feels he can be successful on his own.  Pt declines the need for medications such as nicotine patch, medications such as wellbutrin and chantix. Pt given tobacco cessation information.  Pt oriented to exercise equipment and routine.  Understanding verbalized. Cherre Huger, BSN

## 2015-06-13 ENCOUNTER — Encounter: Payer: Self-pay | Admitting: Cardiothoracic Surgery

## 2015-06-13 ENCOUNTER — Encounter (HOSPITAL_COMMUNITY)
Admission: RE | Admit: 2015-06-13 | Discharge: 2015-06-13 | Disposition: A | Discharge status: Home / Self Care | Payer: Medicare Other | Source: Ambulatory Visit | Attending: Cardiology | Admitting: Cardiology

## 2015-06-13 ENCOUNTER — Ambulatory Visit (INDEPENDENT_AMBULATORY_CARE_PROVIDER_SITE_OTHER): Payer: Medicare Other | Admitting: Cardiothoracic Surgery

## 2015-06-13 VITALS — BP 118/72 | HR 80 | Resp 20 | Ht 69.0 in | Wt 168.0 lb

## 2015-06-13 DIAGNOSIS — I2510 Atherosclerotic heart disease of native coronary artery without angina pectoris: Secondary | ICD-10-CM

## 2015-06-13 DIAGNOSIS — Z951 Presence of aortocoronary bypass graft: Secondary | ICD-10-CM | POA: Diagnosis not present
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
## 2015-06-15 ENCOUNTER — Encounter (HOSPITAL_COMMUNITY)
Admission: RE | Admit: 2015-06-15 | Discharge: 2015-06-15 | Disposition: A | Discharge status: Home / Self Care | Payer: Medicare Other | Source: Ambulatory Visit | Attending: Cardiology | Admitting: Cardiology

## 2015-06-15 DIAGNOSIS — Z951 Presence of aortocoronary bypass graft: Secondary | ICD-10-CM | POA: Diagnosis not present

## 2015-06-18 ENCOUNTER — Encounter (HOSPITAL_COMMUNITY)
Admission: RE | Admit: 2015-06-18 | Discharge: 2015-06-18 | Disposition: A | Discharge status: Home / Self Care | Payer: Medicare Other | Source: Ambulatory Visit | Attending: Cardiology | Admitting: Cardiology

## 2015-06-18 DIAGNOSIS — Z951 Presence of aortocoronary bypass graft: Secondary | ICD-10-CM | POA: Diagnosis not present

## 2015-06-20 ENCOUNTER — Encounter (HOSPITAL_COMMUNITY)
Admission: RE | Admit: 2015-06-20 | Discharge: 2015-06-20 | Disposition: A | Discharge status: Home / Self Care | Payer: Medicare Other | Source: Ambulatory Visit | Attending: Cardiology | Admitting: Cardiology

## 2015-06-20 DIAGNOSIS — Z951 Presence of aortocoronary bypass graft: Secondary | ICD-10-CM | POA: Insufficient documentation

## 2015-06-22 ENCOUNTER — Encounter (HOSPITAL_COMMUNITY)
Admission: RE | Admit: 2015-06-22 | Discharge: 2015-06-22 | Disposition: A | Discharge status: Home / Self Care | Payer: Medicare Other | Source: Ambulatory Visit | Attending: Cardiology | Admitting: Cardiology

## 2015-06-22 DIAGNOSIS — Z951 Presence of aortocoronary bypass graft: Secondary | ICD-10-CM | POA: Diagnosis not present

## 2015-06-25 ENCOUNTER — Encounter (HOSPITAL_COMMUNITY)
Admission: RE | Admit: 2015-06-25 | Discharge: 2015-06-25 | Disposition: A | Discharge status: Home / Self Care | Payer: Medicare Other | Source: Ambulatory Visit | Attending: Cardiology | Admitting: Cardiology

## 2015-06-25 DIAGNOSIS — Z951 Presence of aortocoronary bypass graft: Secondary | ICD-10-CM | POA: Diagnosis not present

## 2015-06-27 ENCOUNTER — Encounter (HOSPITAL_COMMUNITY)
Admission: RE | Admit: 2015-06-27 | Discharge: 2015-06-27 | Disposition: A | Discharge status: Home / Self Care | Payer: Medicare Other | Source: Ambulatory Visit | Attending: Cardiology | Admitting: Cardiology

## 2015-06-27 DIAGNOSIS — Z951 Presence of aortocoronary bypass graft: Secondary | ICD-10-CM | POA: Diagnosis not present

## 2015-06-28 ENCOUNTER — Telehealth (HOSPITAL_COMMUNITY): Payer: Self-pay | Admitting: *Deleted

## 2015-06-29 ENCOUNTER — Encounter (HOSPITAL_COMMUNITY): Admission: RE | Admit: 2015-06-29 | Payer: Medicare Other | Source: Ambulatory Visit

## 2015-07-02 ENCOUNTER — Encounter (HOSPITAL_COMMUNITY): Payer: Medicare Other

## 2015-07-04 ENCOUNTER — Encounter (HOSPITAL_COMMUNITY): Payer: Medicare Other

## 2015-07-06 ENCOUNTER — Encounter (HOSPITAL_COMMUNITY): Payer: Medicare Other

## 2015-07-09 ENCOUNTER — Encounter (HOSPITAL_COMMUNITY): Payer: Medicare Other

## 2015-07-11 ENCOUNTER — Encounter (HOSPITAL_COMMUNITY): Payer: Medicare Other

## 2015-07-13 ENCOUNTER — Encounter (HOSPITAL_COMMUNITY): Payer: Medicare Other

## 2015-07-16 ENCOUNTER — Encounter (HOSPITAL_COMMUNITY): Payer: Medicare Other

## 2015-07-18 ENCOUNTER — Encounter (HOSPITAL_COMMUNITY): Payer: Medicare Other

## 2015-07-20 ENCOUNTER — Encounter (HOSPITAL_COMMUNITY): Payer: Medicare Other

## 2015-07-23 ENCOUNTER — Encounter (HOSPITAL_COMMUNITY): Payer: Medicare Other

## 2015-07-25 ENCOUNTER — Encounter (HOSPITAL_COMMUNITY): Payer: Medicare Other

## 2015-07-27 ENCOUNTER — Encounter (HOSPITAL_COMMUNITY): Payer: Medicare Other

## 2015-07-30 ENCOUNTER — Encounter (HOSPITAL_COMMUNITY): Payer: Medicare Other

## 2015-08-01 ENCOUNTER — Encounter (HOSPITAL_COMMUNITY): Payer: Medicare Other

## 2015-08-03 ENCOUNTER — Encounter (HOSPITAL_COMMUNITY): Payer: Medicare Other

## 2015-08-06 ENCOUNTER — Encounter (HOSPITAL_COMMUNITY): Payer: Medicare Other

## 2015-08-08 ENCOUNTER — Encounter (HOSPITAL_COMMUNITY): Payer: Medicare Other

## 2015-08-10 ENCOUNTER — Encounter (HOSPITAL_COMMUNITY): Payer: Medicare Other

## 2015-08-13 ENCOUNTER — Encounter (HOSPITAL_COMMUNITY): Payer: Medicare Other

## 2015-08-14 ENCOUNTER — Ambulatory Visit
Admission: RE | Admit: 2015-08-14 | Discharge: 2015-08-14 | Disposition: A | Discharge status: Home / Self Care | Payer: Medicare Other | Source: Ambulatory Visit | Attending: Cardiology | Admitting: Cardiology

## 2015-08-14 ENCOUNTER — Other Ambulatory Visit: Payer: Self-pay | Admitting: Cardiology

## 2015-08-14 DIAGNOSIS — I2510 Atherosclerotic heart disease of native coronary artery without angina pectoris: Secondary | ICD-10-CM | POA: Diagnosis not present

## 2015-08-14 DIAGNOSIS — Z951 Presence of aortocoronary bypass graft: Secondary | ICD-10-CM | POA: Diagnosis not present

## 2015-08-14 DIAGNOSIS — R079 Chest pain, unspecified: Secondary | ICD-10-CM

## 2015-08-14 DIAGNOSIS — R0789 Other chest pain: Secondary | ICD-10-CM | POA: Diagnosis not present

## 2015-08-14 DIAGNOSIS — I252 Old myocardial infarction: Secondary | ICD-10-CM | POA: Diagnosis not present

## 2015-08-14 DIAGNOSIS — E785 Hyperlipidemia, unspecified: Secondary | ICD-10-CM | POA: Diagnosis not present

## 2015-08-15 ENCOUNTER — Encounter (HOSPITAL_COMMUNITY): Payer: Medicare Other

## 2015-08-17 ENCOUNTER — Encounter (HOSPITAL_COMMUNITY): Payer: Medicare Other

## 2015-08-20 ENCOUNTER — Encounter (HOSPITAL_COMMUNITY): Payer: Medicare Other

## 2015-08-22 ENCOUNTER — Encounter (HOSPITAL_COMMUNITY): Payer: Medicare Other

## 2015-08-24 ENCOUNTER — Encounter (HOSPITAL_COMMUNITY): Payer: Medicare Other

## 2015-08-27 ENCOUNTER — Encounter (HOSPITAL_COMMUNITY): Payer: Medicare Other

## 2015-08-29 ENCOUNTER — Encounter (HOSPITAL_COMMUNITY): Payer: Medicare Other

## 2015-08-31 ENCOUNTER — Encounter (HOSPITAL_COMMUNITY): Payer: Medicare Other

## 2015-09-03 ENCOUNTER — Encounter (HOSPITAL_COMMUNITY): Payer: Medicare Other

## 2015-09-05 ENCOUNTER — Encounter (HOSPITAL_COMMUNITY): Payer: Medicare Other

## 2015-09-07 ENCOUNTER — Encounter (HOSPITAL_COMMUNITY): Payer: Medicare Other

## 2015-09-10 ENCOUNTER — Encounter (HOSPITAL_COMMUNITY): Payer: Medicare Other

## 2015-09-12 ENCOUNTER — Encounter (HOSPITAL_COMMUNITY): Payer: Medicare Other

## 2015-09-14 ENCOUNTER — Encounter (HOSPITAL_COMMUNITY): Payer: Medicare Other

## 2015-09-14 DIAGNOSIS — Z951 Presence of aortocoronary bypass graft: Secondary | ICD-10-CM | POA: Diagnosis not present

## 2015-09-14 DIAGNOSIS — E785 Hyperlipidemia, unspecified: Secondary | ICD-10-CM | POA: Diagnosis not present

## 2015-09-14 DIAGNOSIS — R079 Chest pain, unspecified: Secondary | ICD-10-CM | POA: Diagnosis not present

## 2015-09-14 DIAGNOSIS — I252 Old myocardial infarction: Secondary | ICD-10-CM | POA: Diagnosis not present

## 2015-09-14 DIAGNOSIS — I2510 Atherosclerotic heart disease of native coronary artery without angina pectoris: Secondary | ICD-10-CM | POA: Diagnosis not present

## 2015-10-01 DIAGNOSIS — R079 Chest pain, unspecified: Secondary | ICD-10-CM | POA: Diagnosis not present

## 2015-10-08 DIAGNOSIS — I2510 Atherosclerotic heart disease of native coronary artery without angina pectoris: Secondary | ICD-10-CM | POA: Diagnosis not present

## 2015-10-08 DIAGNOSIS — Z125 Encounter for screening for malignant neoplasm of prostate: Secondary | ICD-10-CM | POA: Diagnosis not present

## 2015-10-08 DIAGNOSIS — Z0000 Encounter for general adult medical examination without abnormal findings: Secondary | ICD-10-CM | POA: Diagnosis not present

## 2015-10-08 DIAGNOSIS — E782 Mixed hyperlipidemia: Secondary | ICD-10-CM | POA: Diagnosis not present

## 2015-10-08 DIAGNOSIS — J449 Chronic obstructive pulmonary disease, unspecified: Secondary | ICD-10-CM | POA: Diagnosis not present

## 2015-10-08 DIAGNOSIS — R7301 Impaired fasting glucose: Secondary | ICD-10-CM | POA: Diagnosis not present

## 2015-10-15 DIAGNOSIS — E782 Mixed hyperlipidemia: Secondary | ICD-10-CM | POA: Diagnosis not present

## 2015-10-15 DIAGNOSIS — J449 Chronic obstructive pulmonary disease, unspecified: Secondary | ICD-10-CM | POA: Diagnosis not present

## 2015-10-15 DIAGNOSIS — R7303 Prediabetes: Secondary | ICD-10-CM | POA: Diagnosis not present

## 2015-10-15 DIAGNOSIS — I2510 Atherosclerotic heart disease of native coronary artery without angina pectoris: Secondary | ICD-10-CM | POA: Diagnosis not present

## 2016-01-23 DIAGNOSIS — Z23 Encounter for immunization: Secondary | ICD-10-CM | POA: Diagnosis not present

## 2016-01-29 DIAGNOSIS — Z23 Encounter for immunization: Secondary | ICD-10-CM | POA: Diagnosis not present

## 2016-04-01 DIAGNOSIS — E785 Hyperlipidemia, unspecified: Secondary | ICD-10-CM | POA: Diagnosis not present

## 2016-04-01 DIAGNOSIS — Z951 Presence of aortocoronary bypass graft: Secondary | ICD-10-CM | POA: Diagnosis not present

## 2016-04-01 DIAGNOSIS — R079 Chest pain, unspecified: Secondary | ICD-10-CM | POA: Diagnosis not present

## 2016-04-01 DIAGNOSIS — I2510 Atherosclerotic heart disease of native coronary artery without angina pectoris: Secondary | ICD-10-CM | POA: Diagnosis not present

## 2016-04-01 DIAGNOSIS — I252 Old myocardial infarction: Secondary | ICD-10-CM | POA: Diagnosis not present

## 2016-05-19 DIAGNOSIS — R7303 Prediabetes: Secondary | ICD-10-CM | POA: Diagnosis not present

## 2016-05-19 DIAGNOSIS — E782 Mixed hyperlipidemia: Secondary | ICD-10-CM | POA: Diagnosis not present

## 2016-05-19 DIAGNOSIS — I2510 Atherosclerotic heart disease of native coronary artery without angina pectoris: Secondary | ICD-10-CM | POA: Diagnosis not present

## 2016-09-04 DIAGNOSIS — J329 Chronic sinusitis, unspecified: Secondary | ICD-10-CM | POA: Diagnosis not present

## 2016-09-10 DIAGNOSIS — M65341 Trigger finger, right ring finger: Secondary | ICD-10-CM | POA: Diagnosis not present

## 2016-09-17 DIAGNOSIS — R0602 Shortness of breath: Secondary | ICD-10-CM | POA: Diagnosis not present

## 2016-09-17 DIAGNOSIS — Z86010 Personal history of colonic polyps: Secondary | ICD-10-CM | POA: Diagnosis not present

## 2016-09-17 DIAGNOSIS — I2510 Atherosclerotic heart disease of native coronary artery without angina pectoris: Secondary | ICD-10-CM | POA: Diagnosis not present

## 2016-10-07 DIAGNOSIS — D122 Benign neoplasm of ascending colon: Secondary | ICD-10-CM | POA: Diagnosis not present

## 2016-10-07 DIAGNOSIS — K5730 Diverticulosis of large intestine without perforation or abscess without bleeding: Secondary | ICD-10-CM | POA: Diagnosis not present

## 2016-10-07 DIAGNOSIS — Z86010 Personal history of colonic polyps: Secondary | ICD-10-CM | POA: Diagnosis not present

## 2016-10-07 DIAGNOSIS — K635 Polyp of colon: Secondary | ICD-10-CM | POA: Diagnosis not present

## 2016-10-13 DIAGNOSIS — M65341 Trigger finger, right ring finger: Secondary | ICD-10-CM | POA: Diagnosis not present

## 2016-10-13 DIAGNOSIS — M79641 Pain in right hand: Secondary | ICD-10-CM | POA: Diagnosis not present

## 2016-11-27 DIAGNOSIS — H47292 Other optic atrophy, left eye: Secondary | ICD-10-CM | POA: Diagnosis not present

## 2016-11-27 DIAGNOSIS — H35033 Hypertensive retinopathy, bilateral: Secondary | ICD-10-CM | POA: Diagnosis not present

## 2016-11-27 DIAGNOSIS — H468 Other optic neuritis: Secondary | ICD-10-CM | POA: Diagnosis not present

## 2017-03-19 DIAGNOSIS — D225 Melanocytic nevi of trunk: Secondary | ICD-10-CM | POA: Diagnosis not present

## 2017-03-19 DIAGNOSIS — L821 Other seborrheic keratosis: Secondary | ICD-10-CM | POA: Diagnosis not present

## 2017-03-19 DIAGNOSIS — L720 Epidermal cyst: Secondary | ICD-10-CM | POA: Diagnosis not present

## 2017-03-19 DIAGNOSIS — L570 Actinic keratosis: Secondary | ICD-10-CM | POA: Diagnosis not present

## 2017-03-19 DIAGNOSIS — L723 Sebaceous cyst: Secondary | ICD-10-CM | POA: Diagnosis not present

## 2017-03-19 DIAGNOSIS — D485 Neoplasm of uncertain behavior of skin: Secondary | ICD-10-CM | POA: Diagnosis not present

## 2017-03-19 DIAGNOSIS — D1801 Hemangioma of skin and subcutaneous tissue: Secondary | ICD-10-CM | POA: Diagnosis not present

## 2017-04-07 DIAGNOSIS — E7849 Other hyperlipidemia: Secondary | ICD-10-CM | POA: Diagnosis not present

## 2017-04-07 DIAGNOSIS — I252 Old myocardial infarction: Secondary | ICD-10-CM | POA: Diagnosis not present

## 2017-04-07 DIAGNOSIS — Z951 Presence of aortocoronary bypass graft: Secondary | ICD-10-CM | POA: Diagnosis not present

## 2017-04-07 DIAGNOSIS — I2510 Atherosclerotic heart disease of native coronary artery without angina pectoris: Secondary | ICD-10-CM | POA: Diagnosis not present

## 2017-05-07 DIAGNOSIS — H00024 Hordeolum internum left upper eyelid: Secondary | ICD-10-CM | POA: Diagnosis not present

## 2017-05-25 DIAGNOSIS — L720 Epidermal cyst: Secondary | ICD-10-CM | POA: Diagnosis not present

## 2017-05-28 DIAGNOSIS — Z951 Presence of aortocoronary bypass graft: Secondary | ICD-10-CM | POA: Diagnosis not present

## 2017-05-28 DIAGNOSIS — E7849 Other hyperlipidemia: Secondary | ICD-10-CM | POA: Diagnosis not present

## 2017-05-28 DIAGNOSIS — I252 Old myocardial infarction: Secondary | ICD-10-CM | POA: Diagnosis not present

## 2017-05-28 DIAGNOSIS — R0789 Other chest pain: Secondary | ICD-10-CM | POA: Diagnosis not present

## 2017-05-28 DIAGNOSIS — I2510 Atherosclerotic heart disease of native coronary artery without angina pectoris: Secondary | ICD-10-CM | POA: Diagnosis not present

## 2017-05-28 DIAGNOSIS — R079 Chest pain, unspecified: Secondary | ICD-10-CM | POA: Diagnosis not present

## 2017-06-04 DIAGNOSIS — I2510 Atherosclerotic heart disease of native coronary artery without angina pectoris: Secondary | ICD-10-CM | POA: Diagnosis not present

## 2017-06-04 DIAGNOSIS — E7849 Other hyperlipidemia: Secondary | ICD-10-CM | POA: Diagnosis not present

## 2017-06-04 DIAGNOSIS — R079 Chest pain, unspecified: Secondary | ICD-10-CM | POA: Diagnosis not present

## 2017-06-04 DIAGNOSIS — Z951 Presence of aortocoronary bypass graft: Secondary | ICD-10-CM | POA: Diagnosis not present

## 2017-06-04 DIAGNOSIS — I252 Old myocardial infarction: Secondary | ICD-10-CM | POA: Diagnosis not present

## 2017-06-22 DIAGNOSIS — H00024 Hordeolum internum left upper eyelid: Secondary | ICD-10-CM | POA: Diagnosis not present

## 2017-06-24 DIAGNOSIS — R410 Disorientation, unspecified: Secondary | ICD-10-CM | POA: Diagnosis not present

## 2017-06-24 DIAGNOSIS — R21 Rash and other nonspecific skin eruption: Secondary | ICD-10-CM | POA: Diagnosis not present

## 2017-06-24 DIAGNOSIS — J449 Chronic obstructive pulmonary disease, unspecified: Secondary | ICD-10-CM | POA: Diagnosis not present

## 2017-06-24 DIAGNOSIS — I2510 Atherosclerotic heart disease of native coronary artery without angina pectoris: Secondary | ICD-10-CM | POA: Diagnosis not present

## 2017-07-01 DIAGNOSIS — R410 Disorientation, unspecified: Secondary | ICD-10-CM | POA: Diagnosis not present

## 2017-07-01 DIAGNOSIS — L03211 Cellulitis of face: Secondary | ICD-10-CM | POA: Diagnosis not present

## 2017-07-09 DIAGNOSIS — L989 Disorder of the skin and subcutaneous tissue, unspecified: Secondary | ICD-10-CM | POA: Diagnosis not present

## 2017-07-10 ENCOUNTER — Ambulatory Visit (HOSPITAL_COMMUNITY)
Admission: RE | Admit: 2017-07-10 | Discharge: 2017-07-10 | Disposition: A | Discharge status: Home / Self Care | Payer: PPO | Source: Ambulatory Visit | Attending: Internal Medicine | Admitting: Internal Medicine

## 2017-07-10 ENCOUNTER — Inpatient Hospital Stay (HOSPITAL_COMMUNITY)
Admission: EM | Admit: 2017-07-10 | Discharge: 2017-07-13 | DRG: 501 | Disposition: A | Discharge status: Home / Self Care | Payer: PPO | Attending: Internal Medicine | Admitting: Internal Medicine

## 2017-07-10 ENCOUNTER — Other Ambulatory Visit: Payer: Self-pay

## 2017-07-10 ENCOUNTER — Other Ambulatory Visit (HOSPITAL_COMMUNITY): Payer: Self-pay | Admitting: Internal Medicine

## 2017-07-10 ENCOUNTER — Encounter (HOSPITAL_COMMUNITY): Payer: Self-pay

## 2017-07-10 DIAGNOSIS — M8618 Other acute osteomyelitis, other site: Secondary | ICD-10-CM

## 2017-07-10 DIAGNOSIS — L03213 Periorbital cellulitis: Secondary | ICD-10-CM

## 2017-07-10 DIAGNOSIS — Z8619 Personal history of other infectious and parasitic diseases: Secondary | ICD-10-CM | POA: Diagnosis not present

## 2017-07-10 DIAGNOSIS — H44002 Unspecified purulent endophthalmitis, left eye: Secondary | ICD-10-CM

## 2017-07-10 DIAGNOSIS — J320 Chronic maxillary sinusitis: Secondary | ICD-10-CM | POA: Diagnosis present

## 2017-07-10 DIAGNOSIS — J341 Cyst and mucocele of nose and nasal sinus: Secondary | ICD-10-CM | POA: Diagnosis present

## 2017-07-10 DIAGNOSIS — I2510 Atherosclerotic heart disease of native coronary artery without angina pectoris: Secondary | ICD-10-CM | POA: Diagnosis present

## 2017-07-10 DIAGNOSIS — F17210 Nicotine dependence, cigarettes, uncomplicated: Secondary | ICD-10-CM | POA: Diagnosis present

## 2017-07-10 DIAGNOSIS — H5462 Unqualified visual loss, left eye, normal vision right eye: Secondary | ICD-10-CM | POA: Diagnosis present

## 2017-07-10 DIAGNOSIS — I25110 Atherosclerotic heart disease of native coronary artery with unstable angina pectoris: Secondary | ICD-10-CM | POA: Diagnosis not present

## 2017-07-10 DIAGNOSIS — Z930 Tracheostomy status: Secondary | ICD-10-CM | POA: Diagnosis not present

## 2017-07-10 DIAGNOSIS — Z951 Presence of aortocoronary bypass graft: Secondary | ICD-10-CM

## 2017-07-10 DIAGNOSIS — I252 Old myocardial infarction: Secondary | ICD-10-CM | POA: Diagnosis not present

## 2017-07-10 DIAGNOSIS — M1990 Unspecified osteoarthritis, unspecified site: Secondary | ICD-10-CM | POA: Diagnosis present

## 2017-07-10 DIAGNOSIS — Z7982 Long term (current) use of aspirin: Secondary | ICD-10-CM

## 2017-07-10 DIAGNOSIS — L0201 Cutaneous abscess of face: Secondary | ICD-10-CM | POA: Diagnosis not present

## 2017-07-10 DIAGNOSIS — J019 Acute sinusitis, unspecified: Secondary | ICD-10-CM | POA: Diagnosis present

## 2017-07-10 DIAGNOSIS — J449 Chronic obstructive pulmonary disease, unspecified: Secondary | ICD-10-CM | POA: Diagnosis not present

## 2017-07-10 DIAGNOSIS — M868X8 Other osteomyelitis, other site: Secondary | ICD-10-CM | POA: Diagnosis not present

## 2017-07-10 DIAGNOSIS — B9689 Other specified bacterial agents as the cause of diseases classified elsewhere: Secondary | ICD-10-CM | POA: Diagnosis not present

## 2017-07-10 DIAGNOSIS — H1032 Unspecified acute conjunctivitis, left eye: Secondary | ICD-10-CM | POA: Diagnosis not present

## 2017-07-10 DIAGNOSIS — J322 Chronic ethmoidal sinusitis: Secondary | ICD-10-CM | POA: Diagnosis not present

## 2017-07-10 DIAGNOSIS — J321 Chronic frontal sinusitis: Secondary | ICD-10-CM | POA: Diagnosis not present

## 2017-07-10 HISTORY — DX: Other acute osteomyelitis, other site: M86.18

## 2017-07-10 LAB — CBC WITH DIFFERENTIAL/PLATELET
BASOS ABS: 0.1 10*3/uL (ref 0.0–0.1)
BASOS PCT: 1 %
EOS PCT: 3 %
Eosinophils Absolute: 0.4 10*3/uL (ref 0.0–0.7)
HCT: 49.7 % (ref 39.0–52.0)
Hemoglobin: 17.3 g/dL — ABNORMAL HIGH (ref 13.0–17.0)
LYMPHS PCT: 14 %
Lymphs Abs: 1.7 10*3/uL (ref 0.7–4.0)
MCH: 31.7 pg (ref 26.0–34.0)
MCHC: 34.8 g/dL (ref 30.0–36.0)
MCV: 91 fL (ref 78.0–100.0)
MONO ABS: 1 10*3/uL (ref 0.1–1.0)
Monocytes Relative: 9 %
Neutro Abs: 8.7 10*3/uL — ABNORMAL HIGH (ref 1.7–7.7)
Neutrophils Relative %: 73 %
PLATELETS: 254 10*3/uL (ref 150–400)
RBC: 5.46 MIL/uL (ref 4.22–5.81)
RDW: 13.7 % (ref 11.5–15.5)
WBC: 11.9 10*3/uL — ABNORMAL HIGH (ref 4.0–10.5)

## 2017-07-10 LAB — COMPREHENSIVE METABOLIC PANEL
ALBUMIN: 4.1 g/dL (ref 3.5–5.0)
ALK PHOS: 111 U/L (ref 38–126)
ALT: 27 U/L (ref 17–63)
AST: 18 U/L (ref 15–41)
Anion gap: 9 (ref 5–15)
BILIRUBIN TOTAL: 1.1 mg/dL (ref 0.3–1.2)
BUN: 13 mg/dL (ref 6–20)
CALCIUM: 9.2 mg/dL (ref 8.9–10.3)
CO2: 26 mmol/L (ref 22–32)
CREATININE: 0.87 mg/dL (ref 0.61–1.24)
Chloride: 102 mmol/L (ref 101–111)
GFR calc Af Amer: >60 mL/min (ref >60)
GFR calc non Af Amer: >60 mL/min (ref >60)
GLUCOSE: 97 mg/dL (ref 65–99)
Potassium: 3.8 mmol/L (ref 3.5–5.1)
Sodium: 137 mmol/L (ref 135–145)
TOTAL PROTEIN: 7.8 g/dL (ref 6.5–8.1)

## 2017-07-10 MED ORDER — IOPAMIDOL (ISOVUE-300) INJECTION 61%
INTRAVENOUS | Status: AC
  Filled 2017-07-10: qty 75

## 2017-07-10 MED ORDER — ACETAMINOPHEN 325 MG PO TABS
650.0000 mg | ORAL_TABLET | Freq: Four times a day (QID) | ORAL | Status: DC | PRN
  Administered 2017-07-10: 650 mg via ORAL
  Filled 2017-07-10: qty 2

## 2017-07-10 MED ORDER — KETOROLAC TROMETHAMINE 15 MG/ML IJ SOLN: 15.0000 mg | Freq: Four times a day (QID) | INTRAMUSCULAR | Status: DC | PRN

## 2017-07-10 MED ORDER — AMPICILLIN-SULBACTAM SODIUM 3 (2-1) G IJ SOLR
3.0000 g | Freq: Four times a day (QID) | INTRAMUSCULAR | Status: DC
  Administered 2017-07-11 – 2017-07-13 (×9): 3 g via INTRAVENOUS
  Filled 2017-07-10 (×10): qty 3

## 2017-07-10 MED ORDER — ONDANSETRON HCL 4 MG PO TABS: 4.0000 mg | ORAL_TABLET | Freq: Four times a day (QID) | ORAL | Status: DC | PRN

## 2017-07-10 MED ORDER — ATORVASTATIN CALCIUM 20 MG PO TABS
20.0000 mg | ORAL_TABLET | Freq: Every day | ORAL | Status: DC
  Administered 2017-07-11 – 2017-07-12 (×2): 20 mg via ORAL
  Filled 2017-07-10 (×2): qty 1

## 2017-07-10 MED ORDER — ONDANSETRON HCL 4 MG/2ML IJ SOLN
4.0000 mg | Freq: Four times a day (QID) | INTRAMUSCULAR | Status: DC | PRN
Start: 2017-07-10 — End: 2017-07-13

## 2017-07-10 MED ORDER — AMPICILLIN-SULBACTAM SODIUM 3 (2-1) G IJ SOLR
3.0000 g | Freq: Once | INTRAMUSCULAR | Status: AC
  Administered 2017-07-10: 3 g via INTRAVENOUS
  Filled 2017-07-10: qty 3

## 2017-07-10 MED ORDER — OXYCODONE HCL 5 MG PO TABS
5.0000 mg | ORAL_TABLET | ORAL | Status: DC | PRN
  Administered 2017-07-10 – 2017-07-12 (×7): 5 mg via ORAL
  Filled 2017-07-10 (×7): qty 1

## 2017-07-10 MED ORDER — IOPAMIDOL (ISOVUE-300) INJECTION 61%
75.0000 mL | Freq: Once | INTRAVENOUS | Status: AC | PRN
  Administered 2017-07-10: 75 mL via INTRAVENOUS

## 2017-07-10 MED ORDER — METOPROLOL TARTRATE 12.5 MG HALF TABLET
12.5000 mg | ORAL_TABLET | Freq: Two times a day (BID) | ORAL | Status: DC
  Administered 2017-07-10 – 2017-07-13 (×3): 12.5 mg via ORAL
  Filled 2017-07-10 (×4): qty 1

## 2017-07-10 MED ORDER — SODIUM CHLORIDE 0.9 % IV SOLN
INTRAVENOUS | Status: AC
  Administered 2017-07-10: 23:00:00 via INTRAVENOUS

## 2017-07-10 MED ORDER — ACETAMINOPHEN 650 MG RE SUPP: 650.0000 mg | Freq: Four times a day (QID) | RECTAL | Status: DC | PRN
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
## 2017-07-10 NOTE — Progress Notes (Signed)
Pharmacy Antibiotic Note  Sean Bridges is a 72 y.o. 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 Pharmacy has been consulted for Unasyn dosing.  Plan:  Unasyn 3g IV q6 hr  Renal function appears stable; pharmacy will sign off and follow peripherally for culture results or renal adjustments  Height: 5' 8.5" (174 cm) Weight: 161 lb (73 kg) IBW/kg (Calculated) : 69.55  Temp (24hrs), Avg:97.8 F (36.6 C), Min:97.8 F (36.6 C), Max:97.8 F (36.6 C)  Recent Labs  Lab 07/10/17 1709  WBC 11.9*  CREATININE 0.87    Estimated Creatinine Clearance: 78.9 mL/min (by C-G formula based on SCr of 0.87 mg/dL).    No Known Allergies   Thank you for allowing pharmacy to be a part of this patient's care.  Reuel Boom, PharmD, BCPS [phone] 07/10/2017, 8:53 PM
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
## 2017-07-10 NOTE — ED Triage Notes (Signed)
Patient reports with left eye swollen shut, starting in January. Patient reports he was put on antibiotics for the past couple of months for what appeared to be a right eye infection. Patient reports his swelling around his right eye got really bad yesterday.  Patient reports he saw his PCP for the issue, and had a CT scan completed today at approximately 1300. Patient reports he was called by his PCP and told to report to the ER due to "my sinus has separated in three parts or something like that."
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
## 2017-07-11 ENCOUNTER — Encounter (HOSPITAL_COMMUNITY): Payer: Self-pay | Admitting: Anesthesiology

## 2017-07-11 ENCOUNTER — Inpatient Hospital Stay (HOSPITAL_COMMUNITY): Payer: PPO | Admitting: Certified Registered"

## 2017-07-11 ENCOUNTER — Encounter (HOSPITAL_COMMUNITY)
Admission: EM | Disposition: A | Discharge status: Home / Self Care | Payer: Self-pay | Source: Home / Self Care | Attending: Internal Medicine

## 2017-07-11 DIAGNOSIS — J019 Acute sinusitis, unspecified: Secondary | ICD-10-CM

## 2017-07-11 DIAGNOSIS — M8618 Other acute osteomyelitis, other site: Principal | ICD-10-CM

## 2017-07-11 DIAGNOSIS — B9689 Other specified bacterial agents as the cause of diseases classified elsewhere: Secondary | ICD-10-CM

## 2017-07-11 DIAGNOSIS — I2510 Atherosclerotic heart disease of native coronary artery without angina pectoris: Secondary | ICD-10-CM

## 2017-07-11 HISTORY — PX: INCISION AND DRAINAGE OF PERITONSILLAR ABCESS: SHX6257

## 2017-07-11 LAB — COMPREHENSIVE METABOLIC PANEL
ALT: 20 U/L (ref 17–63)
AST: 15 U/L (ref 15–41)
Albumin: 3.3 g/dL — ABNORMAL LOW (ref 3.5–5.0)
Alkaline Phosphatase: 89 U/L (ref 38–126)
Anion gap: 8 (ref 5–15)
BUN: 12 mg/dL (ref 6–20)
CHLORIDE: 103 mmol/L (ref 101–111)
CO2: 26 mmol/L (ref 22–32)
Calcium: 8.7 mg/dL — ABNORMAL LOW (ref 8.9–10.3)
Creatinine, Ser: 0.81 mg/dL (ref 0.61–1.24)
Glucose, Bld: 109 mg/dL — ABNORMAL HIGH (ref 65–99)
POTASSIUM: 3.9 mmol/L (ref 3.5–5.1)
SODIUM: 137 mmol/L (ref 135–145)
Total Bilirubin: 1 mg/dL (ref 0.3–1.2)
Total Protein: 6.2 g/dL — ABNORMAL LOW (ref 6.5–8.1)

## 2017-07-11 LAB — CBC
HCT: 48.1 % (ref 39.0–52.0)
HEMOGLOBIN: 16 g/dL (ref 13.0–17.0)
MCH: 30.8 pg (ref 26.0–34.0)
MCHC: 33.3 g/dL (ref 30.0–36.0)
MCV: 92.5 fL (ref 78.0–100.0)
Platelets: 220 10*3/uL (ref 150–400)
RBC: 5.2 MIL/uL (ref 4.22–5.81)
RDW: 13.9 % (ref 11.5–15.5)
WBC: 10.3 10*3/uL (ref 4.0–10.5)

## 2017-07-11 LAB — TSH: TSH: 6.148 u[IU]/mL — ABNORMAL HIGH (ref 0.350–4.500)

## 2017-07-11 LAB — SURGICAL PCR SCREEN
MRSA, PCR: NEGATIVE
Staphylococcus aureus: NEGATIVE

## 2017-07-11 LAB — MAGNESIUM: MAGNESIUM: 2 mg/dL (ref 1.7–2.4)

## 2017-07-11 LAB — PHOSPHORUS: Phosphorus: 3.2 mg/dL (ref 2.5–4.6)

## 2017-07-11 SURGERY — INCISION AND DRAINAGE, ABSCESS, PERITONSILLAR
Anesthesia: General | Site: Face | Laterality: Left

## 2017-07-11 MED ORDER — PHENYLEPHRINE 40 MCG/ML (10ML) SYRINGE FOR IV PUSH (FOR BLOOD PRESSURE SUPPORT)
PREFILLED_SYRINGE | INTRAVENOUS | Status: DC | PRN
  Administered 2017-07-11: 200 ug via INTRAVENOUS
  Administered 2017-07-11: 120 ug via INTRAVENOUS

## 2017-07-11 MED ORDER — LIDOCAINE 2% (20 MG/ML) 5 ML SYRINGE
INTRAMUSCULAR | Status: DC | PRN
  Administered 2017-07-11: 100 mg via INTRAVENOUS

## 2017-07-11 MED ORDER — 0.9 % SODIUM CHLORIDE (POUR BTL) OPTIME
TOPICAL | Status: DC | PRN
  Administered 2017-07-11: 1000 mL

## 2017-07-11 MED ORDER — LIDOCAINE-EPINEPHRINE 1 %-1:100000 IJ SOLN
INTRAMUSCULAR | Status: DC | PRN
  Administered 2017-07-11: 1.5 mL

## 2017-07-11 MED ORDER — FENTANYL CITRATE (PF) 250 MCG/5ML IJ SOLN
INTRAMUSCULAR | Status: AC
  Filled 2017-07-11: qty 5

## 2017-07-11 MED ORDER — HYDROMORPHONE HCL 1 MG/ML IJ SOLN: 0.2500 mg | INTRAMUSCULAR | Status: DC | PRN

## 2017-07-11 MED ORDER — LIDOCAINE-EPINEPHRINE 1 %-1:100000 IJ SOLN
INTRAMUSCULAR | Status: AC
  Filled 2017-07-11: qty 1

## 2017-07-11 MED ORDER — ONDANSETRON HCL 4 MG/2ML IJ SOLN
INTRAMUSCULAR | Status: DC | PRN
  Administered 2017-07-11: 4 mg via INTRAVENOUS

## 2017-07-11 MED ORDER — FENTANYL CITRATE (PF) 250 MCG/5ML IJ SOLN
INTRAMUSCULAR | Status: DC | PRN
  Administered 2017-07-11 (×3): 50 ug via INTRAVENOUS
  Administered 2017-07-11: 100 ug via INTRAVENOUS

## 2017-07-11 MED ORDER — MIDAZOLAM HCL 2 MG/2ML IJ SOLN
INTRAMUSCULAR | Status: AC
  Filled 2017-07-11: qty 2

## 2017-07-11 MED ORDER — MIDAZOLAM HCL 2 MG/2ML IJ SOLN
INTRAMUSCULAR | Status: DC | PRN
  Administered 2017-07-11 (×2): 1 mg via INTRAVENOUS

## 2017-07-11 MED ORDER — EPHEDRINE SULFATE-NACL 50-0.9 MG/10ML-% IV SOSY
PREFILLED_SYRINGE | INTRAVENOUS | Status: DC | PRN
  Administered 2017-07-11: 15 mg via INTRAVENOUS
  Administered 2017-07-11: 10 mg via INTRAVENOUS

## 2017-07-11 MED ORDER — LACTATED RINGERS IV SOLN
INTRAVENOUS | Status: DC | PRN
  Administered 2017-07-11: 10:00:00 via INTRAVENOUS

## 2017-07-11 MED ORDER — MEPERIDINE HCL 50 MG/ML IJ SOLN: 6.2500 mg | INTRAMUSCULAR | Status: DC | PRN

## 2017-07-11 MED ORDER — PROPOFOL 10 MG/ML IV BOLUS
INTRAVENOUS | Status: DC | PRN
  Administered 2017-07-11: 100 mg via INTRAVENOUS

## 2017-07-11 MED ORDER — PROMETHAZINE HCL 25 MG/ML IJ SOLN: 6.2500 mg | INTRAMUSCULAR | Status: DC | PRN

## 2017-07-11 MED ORDER — PROPOFOL 10 MG/ML IV BOLUS
INTRAVENOUS | Status: AC
  Filled 2017-07-11: qty 20

## 2017-07-11 SURGICAL SUPPLY — 37 items
ATTRACTOMAT 16X20 MAGNETIC DRP (DRAPES) IMPLANT
BAG SPEC THK2 15X12 ZIP CLS (MISCELLANEOUS) ×1
BAG ZIPLOCK 12X15 (MISCELLANEOUS) ×2 IMPLANT
CATH ROBINSON RED A/P 10FR (CATHETERS) ×2 IMPLANT
CONT SPEC 4OZ CLIKSEAL STRL BL (MISCELLANEOUS) ×2 IMPLANT
DRAIN PENROSE 18X1/4 LTX STRL (WOUND CARE) ×2 IMPLANT
DRSG EMULSION OIL 3X3 NADH (GAUZE/BANDAGES/DRESSINGS) IMPLANT
ELECT COATED BLADE 2.86 ST (ELECTRODE) ×2 IMPLANT
ELECT NEEDLE TIP 2.8 STRL (NEEDLE) ×4 IMPLANT
ELECT PENCIL ROCKER SW 15FT (MISCELLANEOUS) ×2 IMPLANT
ELECT REM PT RETURN 15FT ADLT (MISCELLANEOUS) ×2 IMPLANT
GAUZE SPONGE 4X4 12PLY STRL (GAUZE/BANDAGES/DRESSINGS) ×2 IMPLANT
GAUZE SPONGE 4X4 16PLY XRAY LF (GAUZE/BANDAGES/DRESSINGS) ×2 IMPLANT
GLOVE BIO SURGEON STRL SZ7.5 (GLOVE) ×2 IMPLANT
KIT BASIN OR (CUSTOM PROCEDURE TRAY) ×2 IMPLANT
MARKER SKIN DUAL TIP RULER LAB (MISCELLANEOUS) ×2 IMPLANT
NEEDLE HYPO 25X1 1.5 SAFETY (NEEDLE) ×2 IMPLANT
NS IRRIG 1000ML POUR BTL (IV SOLUTION) ×2 IMPLANT
PACK EENT SPLIT (PACKS) ×2 IMPLANT
POSITIONER SURGICAL ARM (MISCELLANEOUS) ×2 IMPLANT
SOL PREP POV-IOD 4OZ 10% (MISCELLANEOUS) ×2 IMPLANT
SUT CHROMIC 4 0 P 3 18 (SUTURE) IMPLANT
SUT ETHILON 3 0 PS 1 (SUTURE) ×2 IMPLANT
SUT ETHILON 4 0 PS 2 18 (SUTURE) IMPLANT
SUT ETHILON 5 0 P 3 18 (SUTURE)
SUT ETHILON 6 0 PS 3 18 (SUTURE) ×2 IMPLANT
SUT NYLON ETHILON 5-0 P-3 1X18 (SUTURE) IMPLANT
SUT SILK 3 0 (SUTURE)
SUT SILK 3-0 KS 30XBRD (SUTURE) IMPLANT
SWAB COLLECTION DEVICE MRSA (MISCELLANEOUS) ×2 IMPLANT
SWAB CULTURE ESWAB REG 1ML (MISCELLANEOUS) ×2 IMPLANT
SYR BULB IRRIGATION 50ML (SYRINGE) ×2 IMPLANT
SYR CONTROL 10ML LL (SYRINGE) IMPLANT
TAPE SURG TRANSPORE 1 IN (GAUZE/BANDAGES/DRESSINGS) ×1 IMPLANT
TAPE SURGICAL TRANSPORE 1 IN (GAUZE/BANDAGES/DRESSINGS) ×1
WATER STERILE IRR 1000ML POUR (IV SOLUTION) ×2 IMPLANT
YANKAUER SUCT BULB TIP 10FT TU (MISCELLANEOUS) ×2 IMPLANT

## 2017-07-11 NOTE — Transfer of Care (Signed)
Immediate Anesthesia Transfer of Care Note  Patient: Sean Bridges  Procedure(s) Performed: INCISION AND DRAINAGE OF LEFT BROW ABSCESS (Left Face)  Patient Location: PACU  Anesthesia Type:General  Level of Consciousness: sedated  Airway & Oxygen Therapy: Patient Spontanous Breathing and Patient connected to face mask oxygen  Post-op Assessment: Report given to RN and Post -op Vital signs reviewed and stable  Post vital signs: Reviewed and stable  Last Vitals:  Vitals Value Taken Time  BP    Temp    Pulse    Resp    SpO2      Last Pain:  Vitals:   07/11/17 0810  TempSrc:   PainSc: 2       Patients Stated Pain Goal: 2 (68/08/81 1031)  Complications: No apparent anesthesia complications

## 2017-07-11 NOTE — Brief Op Note (Signed)
07/11/2017  11:05 AM  PATIENT:  Sean Bridges  72 y.o. male  PRE-OPERATIVE DIAGNOSIS:  left brow abscess  POST-OPERATIVE DIAGNOSIS:  left brow abscess  PROCEDURE:  Procedure(s): INCISION AND DRAINAGE OF LEFT BROW ABSCESS (Left)  SURGEON:  Surgeon(s) and Role:    Melida Quitter, MD - Primary  PHYSICIAN ASSISTANT:   ASSISTANTS: none   ANESTHESIA:   general  EBL:  2 mL   BLOOD ADMINISTERED:none  DRAINS: [place] drain in the left brow   LOCAL MEDICATIONS USED:  LIDOCAINE   SPECIMEN:  No Specimen  DISPOSITION OF SPECIMEN:  N/A  COUNTS:  YES  TOURNIQUET:  * No tourniquets in log *  DICTATION: .Other Dictation: Dictation Number [id number]  PLAN OF CARE: Return to room  PATIENT DISPOSITION:  PACU - hemodynamically stable.   Delay start of Pharmacological VTE agent (>24hrs) due to surgical blood loss or risk of bleeding: no

## 2017-07-11 NOTE — Anesthesia Procedure Notes (Signed)
Date/Time: 07/11/2017 11:09 AM Performed by: Cynda Familia, CRNA Oxygen Delivery Method: Non-rebreather mask Airway Equipment and Method: Oral airway Placement Confirmation: positive ETCO2 and breath sounds checked- equal and bilateral

## 2017-07-11 NOTE — Anesthesia Procedure Notes (Signed)
Procedure Name: LMA Insertion Date/Time: 07/11/2017 10:39 AM Performed by: Cynda Familia, CRNA Pre-anesthesia Checklist: Patient identified, Emergency Drugs available, Suction available and Patient being monitored Patient Re-evaluated:Patient Re-evaluated prior to induction Oxygen Delivery Method: Circle System Utilized Preoxygenation: Pre-oxygenation with 100% oxygen Induction Type: IV induction Ventilation: Mask ventilation without difficulty LMA: LMA inserted LMA Size: 4.0 Number of attempts: 1 Placement Confirmation: positive ETCO2 Tube secured with: Tape Dental Injury: Teeth and Oropharynx as per pre-operative assessment  Comments: Smooth IV induction Hatchette-- LMA insertion AM CRNA atraumatic--- teeth and mouth as preop --- many missing/chipped teeth--- poor dentition--- bilat BS Hatchette

## 2017-07-11 NOTE — Anesthesia Preprocedure Evaluation (Addendum)
Anesthesia Evaluation  Patient identified by MRN, date of birth, ID band Patient awake    Reviewed: Allergy & Precautions, NPO status , Patient's Chart, lab work & pertinent test results, reviewed documented beta blocker date and time   Airway Mallampati: I       Dental no notable dental hx. (+) Teeth Intact, Dental Advisory Given, Poor Dentition, Chipped, Missing   Pulmonary Current Smoker,    Pulmonary exam normal breath sounds clear to auscultation       Cardiovascular + CAD and + CABG  Normal cardiovascular exam Rhythm:Regular Rate:Normal     Neuro/Psych negative neurological ROS  negative psych ROS   GI/Hepatic negative GI ROS, Neg liver ROS,   Endo/Other    Renal/GU negative Renal ROS  negative genitourinary   Musculoskeletal   Abdominal Normal abdominal exam  (+)   Peds  Hematology   Anesthesia Other Findings   Reproductive/Obstetrics                            Anesthesia Physical Anesthesia Plan  ASA: II  Anesthesia Plan: General   Post-op Pain Management:    Induction: Intravenous  PONV Risk Score and Plan: 1 and Ondansetron  Airway Management Planned: LMA  Additional Equipment:   Intra-op Plan:   Post-operative Plan:   Informed Consent: I have reviewed the patients History and Physical, chart, labs and discussed the procedure including the risks, benefits and alternatives for the proposed anesthesia with the patient or authorized representative who has indicated his/her understanding and acceptance.   Dental advisory given  Plan Discussed with: CRNA and Surgeon  Anesthesia Plan Comments:         Anesthesia Quick Evaluation

## 2017-07-11 NOTE — Progress Notes (Signed)
[facility] PROGRESS NOTE  Sean Bridges [id number]:[id number] DOB: May 22, 1945 DOA: 07/10/2017  PCP: [name], MD  Brief History/Interval Summary: 72 year old Caucasian male with a past medical history of coronary artery disease status post CABG in 2017, facial fractures presented with swelling of the left eye ongoing for the past many weeks.  Patient has had 3 courses of antibiotics which did result in some improvement however symptoms kept recurring.  Evaluation revealed frontal sinus opacification with osseous destruction.  ENT was consulted.  Patient was hospitalized for further management.  Reason for Visit: Left frontal sinusitis with osseous destruction  Consultants: ENT  Procedures: Plan is for surgery today  Antibiotics: Unasyn  Subjective/Interval History: Patient continues to have pain in his left eye and forehead.  5 out of 10 intensity currently.  Waiting on his surgery.  Denies any chest pain shortness of breath.    ROS: Denies any vomiting  Objective:  Vital Signs  Vitals:   07/11/17 1115 07/11/17 1125 07/11/17 1130 07/11/17 1145  BP: (!) 101/58 124/70 130/74 120/67  Pulse: 85 81 86 85  Resp: 16 14 14 14   Temp: 98.9 F (37.2 C)   98.1 F (36.7 C)  TempSrc:      SpO2: 95% 96% 95% 94%  Weight:      Height:        Intake/Output Summary (Last 24 hours) at 07/11/2017 1148 Last data filed at 07/11/2017 1104 Gross per 24 hour  Intake 1573.34 ml  Output 202 ml  Net 1371.34 ml   Filed Weights   07/10/17 1420  Weight: 73 kg (161 lb)    General appearance: alert, cooperative, appears stated age and no distress Head: Erythema noted over the left forehead and eye.  Left eye is swollen shut.  Warm to touch. Resp: clear to auscultation bilaterally Cardio: regular rate and rhythm, S1, S2 normal, no murmur, click, rub or gallop GI: soft, non-tender; bowel sounds normal; no masses,  no organomegaly Extremities: extremities normal, atraumatic, no cyanosis  or edema Pulses: 2+ and symmetric Neurologic: No obvious focal neurological deficits.  Lab Results:  Data Reviewed: I have personally reviewed following labs and imaging studies  CBC: Recent Labs  Lab 07/10/17 1709 07/11/17 0523  WBC 11.9* 10.3  NEUTROABS 8.7*  --   HGB 17.3* 16.0  HCT 49.7 48.1  MCV 91.0 92.5  PLT 254 637    Basic Metabolic Panel: Recent Labs  Lab 07/10/17 1709 07/11/17 0523  NA 137 137  K 3.8 3.9  CL 102 103  CO2 26 26  GLUCOSE 97 109*  BUN 13 12  CREATININE 0.87 0.81  CALCIUM 9.2 8.7*  MG  --  2.0  PHOS  --  3.2    GFR: Estimated Creatinine Clearance: 84.7 mL/min (by C-G formula based on SCr of 0.81 mg/dL).  Liver Function Tests: Recent Labs  Lab 07/10/17 1709 07/11/17 0523  AST 18 15  ALT 27 20  ALKPHOS 111 89  BILITOT 1.1 1.0  PROT 7.8 6.2*  ALBUMIN 4.1 3.3*    Thyroid Function Tests: Recent Labs    07/11/17 0523  TSH 6.148*     Recent Results (from the past 240 hour(s))  Surgical pcr screen     Status: None   Collection Time: 07/11/17  9:21 AM  Result Value Ref Range Status   MRSA, PCR NEGATIVE NEGATIVE Final   Staphylococcus aureus NEGATIVE NEGATIVE Final    Comment: (NOTE) The Xpert SA Assay (FDA approved for NASAL specimens in patients  60 years of age and older), is one component of a comprehensive surveillance program. It is not intended to diagnose infection nor to guide or monitor treatment. Performed at [facility], [place] [address], [place], [place] [address]       Radiology Studies: Ct Orbits W Contrast  Result Date: 07/10/2017 CLINICAL DATA:  Infection of the left eye. EXAM: CT ORBITS WITH CONTRAST TECHNIQUE: Multidetector CT images was performed according to the standard protocol following intravenous contrast administration. CONTRAST:  68mL ISOVUE-300 IOPAMIDOL (ISOVUE-300) INJECTION 61% COMPARISON:  CT of the orbits 04/17/2003 FINDINGS: Orbits: Globes and postseptal orbits are  within normal limits bilaterally. There is extensive soft tissue swelling about the scratched at there is extensive periorbital soft tissue swelling about the left orbit. Visualized sinuses: The left frontal sinus is opacified. There is erosion of the anterior superior and inferolateral aspects of the sinus extension in communication into the subgaleal soft tissues. A gas collection is present within the left frontal scalp measuring 19 mm in transverse diameter. There is soft tissue opacification across the frontoethmoidal recess the into the anterior right frontal lobes. The left frontal sinus is also completely opacified. Destructive erosion is noted anterior and inferior and inferolateral with minimal gas evident along the posterior left maxillary alveolar ridge. The right frontal sinus is near completely opacified. No osseous destruction is evident. Anterior ethmoid air cells are opacified bilaterally. Significant left maxillary dental disease is present with a large dental caries in the first left maxillary molar in the left canine tooth. There is significant lucency surrounding the roots of the left first canine molar. Soft tissues: Extensive left periorbital soft tissue swelling extends superiorly into the left frontal scalp with the aforementioned gas collection. Limited intracranial: There is no evidence for posterior osseous destruction in the left frontal sinus. No extra-axial empyema is evident. The visualized brain is within normal limits. IMPRESSION: 1. Left frontal sinus opacification with anterior wall osseous destruction and communication into the subgaleal soft tissues with extensive associated soft tissue swelling extending to the level of the orbit. Destruction 2. Additional osseous destruction inferiorly laterally in the left frontal sinus just above the left orbit. 3. Chronic left maxillary sinus opacification with osseous destruction inferiorly both anteriorly and laterally. A small gas  collection is noted along the posterolateral maxillary alveolar ridge. 4. No evidence for intracranial extension of infection from the left frontal sinus. 5. Diffuse anterior ethmoid and right frontal sinus disease without other osseous destruction. 6. Diffuse left periorbital soft tissue swelling without postseptal extension into the orbit. These results were called by telephone at the time of interpretation on 07/10/2017 at 1:51 pm to Dr. [name] , who verbally acknowledged these results. Electronically Signed   By: San Morelle M.D.   On: 07/10/2017 14:03     Medications:  Scheduled: . [MAR Hold] atorvastatin  20 mg Oral q1800  . [MAR Hold] metoprolol tartrate  12.5 mg Oral BID   Continuous: . [MAR Hold] ampicillin-sulbactam (UNASYN) IV Stopped (07/11/17 0950)   PRN:[MAR Hold] acetaminophen **OR** [MAR Hold] acetaminophen, HYDROmorphone (DILAUDID) injection, [MAR Hold] ketorolac, meperidine (DEMEROL) injection, [MAR Hold] ondansetron **OR** [MAR Hold] ondansetron (ZOFRAN) IV, [MAR Hold] oxyCODONE, promethazine  Assessment/Plan:  Active Problems:   CAD (coronary artery disease), native coronary artery   Acute osteomyelitis of facial bone (HCC)    Left frontal sinusitis with osseous destruction concerning for osteomyelitis ENT is following.  No orbital involvement noted.  Patient was placed on Unasyn.  Pain control.  Plan is for surgery today.  History of coronary artery disease status post CABG in 2017 Patient denies any cardiac symptoms.  He is fairly active at home and does not develop chest pain or shortness of breath.  Continue home medications.  DVT Prophylaxis: SCDs    Code Status: Full code Family Communication: Discussed with the patient Disposition Plan: Management as outlined above.    LOS: 1 day   [facility] Pager [phone] 07/11/2017, 11:48 AM  If 7PM-7AM, please contact night-coverage at [email or web address], password  [facility]

## 2017-07-11 NOTE — Anesthesia Postprocedure Evaluation (Signed)
Anesthesia Post Note  Patient: Sean Bridges  Procedure(s) Performed: INCISION AND DRAINAGE OF LEFT BROW ABSCESS (Left Face)     Patient location during evaluation: PACU Anesthesia Type: General Level of consciousness: awake and sedated Pain management: pain level controlled Vital Signs Assessment: post-procedure vital signs reviewed and stable Respiratory status: spontaneous breathing Cardiovascular status: stable Postop Assessment: no apparent nausea or vomiting Anesthetic complications: no    Last Vitals:  Vitals:   07/11/17 1130 07/11/17 1145  BP: 130/74 120/67  Pulse: 86 85  Resp: 14 14  Temp:  36.7 C  SpO2: 95% 94%    Last Pain:  Vitals:   07/11/17 1145  TempSrc:   PainSc: 0-No pain   Pain Goal: Patients Stated Pain Goal: 2 (07/11/17 0810)               Va Broadwell JR,JOHN Mateo Flow

## 2017-07-12 ENCOUNTER — Encounter (HOSPITAL_COMMUNITY): Payer: Self-pay | Admitting: Otolaryngology

## 2017-07-12 LAB — BASIC METABOLIC PANEL
Anion gap: 7 (ref 5–15)
BUN: 10 mg/dL (ref 6–20)
CHLORIDE: 101 mmol/L (ref 101–111)
CO2: 27 mmol/L (ref 22–32)
Calcium: 8.4 mg/dL — ABNORMAL LOW (ref 8.9–10.3)
Creatinine, Ser: 0.87 mg/dL (ref 0.61–1.24)
Glucose, Bld: 129 mg/dL — ABNORMAL HIGH (ref 65–99)
POTASSIUM: 3.6 mmol/L (ref 3.5–5.1)
Sodium: 135 mmol/L (ref 135–145)

## 2017-07-12 LAB — CBC
HCT: 43 % (ref 39.0–52.0)
HEMOGLOBIN: 13.9 g/dL (ref 13.0–17.0)
MCH: 30.2 pg (ref 26.0–34.0)
MCHC: 32.3 g/dL (ref 30.0–36.0)
MCV: 93.3 fL (ref 78.0–100.0)
PLATELETS: 207 10*3/uL (ref 150–400)
RBC: 4.61 MIL/uL (ref 4.22–5.81)
RDW: 13.9 % (ref 11.5–15.5)
WBC: 10.1 10*3/uL (ref 4.0–10.5)

## 2017-07-12 MED ORDER — POLYETHYLENE GLYCOL 3350 17 G PO PACK: 17.0000 g | PACK | Freq: Every day | ORAL | Status: DC | PRN

## 2017-07-12 MED ORDER — POTASSIUM CHLORIDE CRYS ER 20 MEQ PO TBCR
40.0000 meq | EXTENDED_RELEASE_TABLET | Freq: Once | ORAL | Status: AC
  Administered 2017-07-12: 40 meq via ORAL
  Filled 2017-07-12: qty 2

## 2017-07-12 NOTE — Progress Notes (Signed)
   Subjective:    Patient ID: Sean Bridges, male    DOB: 04-09-1946, 72 y.o.   MRN: [id number]  HPI Less pain and swelling around the left eye.  Drain producing pus.  Review of Systems     Objective:   Physical Exam Tm 100.3 VSS Alert, NAD Left periorbital edema improving.  Still tender in medial left brow.  Penrose drain in place with purulent drainage.    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  He may be able to be discharged tomorrow if he continues to improve.  Will leave Penrose drain in place for 4-5 days total.  Will plan endoscopic sinus surgery after a few weeks.

## 2017-07-12 NOTE — Progress Notes (Signed)
[facility] PROGRESS NOTE  Sean Bridges PYP:[id number] DOB: 01/31/1946 DOA: 07/10/2017  PCP: [name], MD  Brief History/Interval Summary: 72 year old Caucasian male with a past medical history of coronary artery disease status post CABG in 2017, facial fractures presented with swelling of the left eye ongoing for the past many weeks.  Patient has had 3 courses of antibiotics which did result in some improvement however symptoms kept recurring.  Evaluation revealed frontal sinus opacification with osseous destruction.  ENT was consulted.  Patient was hospitalized for further management.  Reason for Visit: Left frontal sinusitis with osseous destruction  Consultants: ENT  Procedures: INCISION AND DRAINAGE OF LEFT BROW ABSCESS (Left)  Antibiotics: Unasyn  Subjective/Interval History: Patient states that he is feeling better.  Pain in the left eye area has improved.  Denies any nausea vomiting.  Feels constipated but refusing laxatives at this time.  ROS: Denies any chest pain or shortness of breath  Objective:  Vital Signs  Vitals:   07/11/17 1513 07/11/17 2048 07/12/17 0101 07/12/17 0349  BP: (!) 101/58 (!) 95/48 (!) 102/49 (!) 91/57  Pulse: 80 93 78 72  Resp: 16 17 17 16   Temp: 98.4 F (36.9 C) 100.3 F (37.9 C) 99.6 F (37.6 C) 99.5 F (37.5 C)  TempSrc: Oral Oral Oral Oral  SpO2: 95% 93% 94% 94%  Weight:      Height:        Intake/Output Summary (Last 24 hours) at 07/12/2017 1247 Last data filed at 07/12/2017 0616 Gross per 24 hour  Intake 1160 ml  Output 1600 ml  Net -440 ml   Filed Weights   07/10/17 1420  Weight: 73 kg (161 lb)    General appearance: Awake alert.  In no distress Head: Improving erythema over the left forehead.  The left eye is covered by dressing.  Still somewhat tender to palpate. Resp: Clear to auscultation bilaterally Cardio: S1-S2 is normal regular.  No S3-S4.  No rubs murmurs or bruit GI: Abdomen soft.  Nontender  nondistended.  Bowel sounds are present.  No masses organomegaly Extremities: No edema Pulses: 2+ and symmetric Neurologic: No obvious focal neurological deficits.  Lab Results:  Data Reviewed: I have personally reviewed following labs and imaging studies  CBC: Recent Labs  Lab 07/10/17 1709 07/11/17 0523 07/12/17 0423  WBC 11.9* 10.3 10.1  NEUTROABS 8.7*  --   --   HGB 17.3* 16.0 13.9  HCT 49.7 48.1 43.0  MCV 91.0 92.5 93.3  PLT 254 220 245    Basic Metabolic Panel: Recent Labs  Lab 07/10/17 1709 07/11/17 0523 07/12/17 0423  NA 137 137 135  K 3.8 3.9 3.6  CL 102 103 101  CO2 26 26 27   GLUCOSE 97 109* 129*  BUN 13 12 10   CREATININE 0.87 0.81 0.87  CALCIUM 9.2 8.7* 8.4*  MG  --  2.0  --   PHOS  --  3.2  --     GFR: Estimated Creatinine Clearance: 78.9 mL/min (by C-G formula based on SCr of 0.87 mg/dL).  Liver Function Tests: Recent Labs  Lab 07/10/17 1709 07/11/17 0523  AST 18 15  ALT 27 20  ALKPHOS 111 89  BILITOT 1.1 1.0  PROT 7.8 6.2*  ALBUMIN 4.1 3.3*    Thyroid Function Tests: Recent Labs    07/11/17 0523  TSH 6.148*     Recent Results (from the past 240 hour(s))  Culture, blood (routine x 2)     Status: None (Preliminary result)  Collection Time: 07/10/17  5:10 PM  Result Value Ref Range Status   Specimen Description   Final    BLOOD LEFT WRIST Performed at [facility] [address] [place], [place] [address]    Special Requests   Final    BOTTLES DRAWN AEROBIC AND ANAEROBIC Blood Culture adequate volume Performed at [facility] [address], [place], [place] [address]    Culture   Final    NO GROWTH < 24 HOURS Performed at [facility] [address] [place], [place] [address]    Report Status PENDING  Incomplete  Culture, blood (routine x 2)     Status: None (Preliminary result)   Collection Time: 07/10/17  5:35 PM  Result Value Ref Range Status   Specimen Description    Final    BLOOD LEFT FOREARM Performed at [facility] [address], [place], [place] [address]    Special Requests   Final    BOTTLES DRAWN AEROBIC AND ANAEROBIC Blood Culture adequate volume Performed at [facility] [address], [place], [place] [address]    Culture   Final    NO GROWTH < 24 HOURS Performed at [facility] [address] [place], [place] [address]    Report Status PENDING  Incomplete  Surgical pcr screen     Status: None   Collection Time: 07/11/17  9:21 AM  Result Value Ref Range Status   MRSA, PCR NEGATIVE NEGATIVE Final   Staphylococcus aureus NEGATIVE NEGATIVE Final    Comment: (NOTE) The Xpert SA Assay (FDA approved for NASAL specimens in patients 75 years of age and older), is one component of a comprehensive surveillance program. It is not intended to diagnose infection nor to guide or monitor treatment. Performed at [facility], [place] [address], [place], [place] [address]   Aerobic/Anaerobic Culture (surgical/deep wound)     Status: None (Preliminary result)   Collection Time: 07/11/17 10:16 AM  Result Value Ref Range Status   Specimen Description   Final    ABSCESS LEFT EYEBROW Performed at [facility] [address], [place], [place] [address]    Special Requests   Final    NONE Performed at [facility], [place] [address] [place], [place] [address]    Gram Stain   Final    FEW WBC PRESENT,BOTH PMN AND MONONUCLEAR NO ORGANISMS SEEN    Culture   Final    NO GROWTH < 24 HOURS Performed at [facility], [place] [address] [place], [place] [address]    Report Status PENDING  Incomplete      Radiology Studies: Ct Orbits W Contrast  Result Date: 07/10/2017 CLINICAL DATA:  Infection of the left eye. EXAM: CT ORBITS WITH CONTRAST TECHNIQUE: Multidetector CT images was performed according to the standard protocol following intravenous  contrast administration. CONTRAST:  65mL ISOVUE-300 IOPAMIDOL (ISOVUE-300) INJECTION 61% COMPARISON:  CT of the orbits 04/17/2003 FINDINGS: Orbits: Globes and postseptal orbits are within normal limits bilaterally. There is extensive soft tissue swelling about the scratched at there is extensive periorbital soft tissue swelling about the left orbit. Visualized sinuses: The left frontal sinus is opacified. There is erosion of the anterior superior and inferolateral aspects of the sinus extension in communication into the subgaleal soft tissues. A gas collection is present within the left frontal scalp measuring 19 mm in transverse diameter. There is soft tissue opacification  across the frontoethmoidal recess the into the anterior right frontal lobes. The left frontal sinus is also completely opacified. Destructive erosion is noted anterior and inferior and inferolateral with minimal gas evident along the posterior left maxillary alveolar ridge. The right frontal sinus is near completely opacified. No osseous destruction is evident. Anterior ethmoid air cells are opacified bilaterally. Significant left maxillary dental disease is present with a large dental caries in the first left maxillary molar in the left canine tooth. There is significant lucency surrounding the roots of the left first canine molar. Soft tissues: Extensive left periorbital soft tissue swelling extends superiorly into the left frontal scalp with the aforementioned gas collection. Limited intracranial: There is no evidence for posterior osseous destruction in the left frontal sinus. No extra-axial empyema is evident. The visualized brain is within normal limits. IMPRESSION: 1. Left frontal sinus opacification with anterior wall osseous destruction and communication into the subgaleal soft tissues with extensive associated soft tissue swelling extending to the level of the orbit. Destruction 2. Additional osseous destruction inferiorly laterally in  the left frontal sinus just above the left orbit. 3. Chronic left maxillary sinus opacification with osseous destruction inferiorly both anteriorly and laterally. A small gas collection is noted along the posterolateral maxillary alveolar ridge. 4. No evidence for intracranial extension of infection from the left frontal sinus. 5. Diffuse anterior ethmoid and right frontal sinus disease without other osseous destruction. 6. Diffuse left periorbital soft tissue swelling without postseptal extension into the orbit. These results were called by telephone at the time of interpretation on 07/10/2017 at 1:51 pm to Dr. [name] , who verbally acknowledged these results. Electronically Signed   By: San Morelle M.D.   On: 07/10/2017 14:03     Medications:  Scheduled: . atorvastatin  20 mg Oral q1800  . metoprolol tartrate  12.5 mg Oral BID   Continuous: . ampicillin-sulbactam (UNASYN) IV Stopped (07/12/17 1101)   NGE:[id number] **OR** acetaminophen, ketorolac, ondansetron **OR** ondansetron (ZOFRAN) IV, oxyCODONE  Assessment/Plan:  Active Problems:   CAD (coronary artery disease), native coronary artery   Acute osteomyelitis of facial bone (HCC)    Left frontal sinusitis with osseous destruction concerning for osteomyelitis Patient seen by ENT.  He was found to have abscess around his left eyebrow area.  He underwent incision and drainage on 3/23.  Remains on Unasyn.  Feels better.  Follow-up on cultures.  It also appears based on notes that he will need to have another surgery in about 2 weeks.  History of coronary artery disease status post CABG in 2017 Patient denies any cardiac symptoms.  He is fairly active at home and does not develop chest pain or shortness of breath.  Blood pressure noted to be borderline low.  Continue to monitor for now.  He is asymptomatic.  DVT Prophylaxis: SCDs    Code Status: Full code Family Communication: Discussed with the  patient Disposition Plan: Ambulate.  Await further ENT input.    LOS: 2 days   [facility] Pager [phone] 07/12/2017, 12:47 PM  If 7PM-7AM, please contact night-coverage at [email or web address], password [facility]

## 2017-07-13 MED ORDER — AMOXICILLIN-POT CLAVULANATE 875-125 MG PO TABS
1.0000 | ORAL_TABLET | Freq: Two times a day (BID) | ORAL | Status: DC
  Administered 2017-07-13: 1 via ORAL
  Filled 2017-07-13: qty 1

## 2017-07-13 MED ORDER — AMOXICILLIN-POT CLAVULANATE 875-125 MG PO TABS: 1.0000 | ORAL_TABLET | Freq: Two times a day (BID) | ORAL | 0 refills | Status: AC

## 2017-07-13 MED ORDER — OXYCODONE HCL 5 MG PO TABS: 5.0000 mg | ORAL_TABLET | Freq: Four times a day (QID) | ORAL | 0 refills | Status: DC | PRN

## 2017-07-13 MED ORDER — POLYETHYLENE GLYCOL 3350 17 G PO PACK: 17.0000 g | PACK | Freq: Every day | ORAL | 0 refills | Status: DC | PRN

## 2017-07-13 NOTE — Discharge Summary (Signed)
[facility]  Physician Discharge Summary   Patient ID: Sean Bridges MRN: [id number] DOB/AGE: 06-06-1945 72 y.o.  Admit date: 07/10/2017 Discharge date: 07/13/2017  PCP: [name], MD  DISCHARGE DIAGNOSES:  Active Problems:   CAD (coronary artery disease), native coronary artery   Acute osteomyelitis of facial bone (HCC)   RECOMMENDATIONS FOR OUTPATIENT FOLLOW UP: 1. Follow-up with Dr. Redmond Baseman on Thursday.   DISCHARGE CONDITION: fair  Diet recommendation: As before  Filed Weights   07/10/17 1420  Weight: 73 kg (161 lb)    INITIAL HISTORY: 72 year old Caucasian male with a past medical history of coronary artery disease status post CABG in 2017, facial fractures presented with swelling of the left eye ongoing for the past many weeks.  Patient has had 3 courses of antibiotics which did result in some improvement however symptoms kept recurring.  Evaluation revealed frontal sinus opacification with osseous destruction.  ENT was consulted.  Patient was hospitalized for further management.  Consultants: ENT  Procedures: INCISION AND DRAINAGE OF LEFT BROW ABSCESS (Left)   HOSPITAL COURSE:   Left frontal sinusitis with osseous destruction concerning for osteomyelitis Patient seen by ENT.  He was found to have abscess around his left eyebrow area.  He underwent incision and drainage on 3/23. Was placed on Unasyn. Feels better this AM. He will need to have another surgery in about 2 weeks. Discussed with Dr. Redmond Baseman who recommends 10 days of Augmentin.  He will be discharged with pain medications.  History of coronary artery disease status post CABG in 2017 Patient denies any cardiac symptoms.  He is fairly active at home and does not develop chest pain or shortness of breath.  Continue with home medications.  Overall stable.  Patient wants to go home.  Okay for discharge with close follow-up with ENT.    PERTINENT LABS:  The results of significant diagnostics  from this hospitalization (including imaging, microbiology, ancillary and laboratory) are listed below for reference.    Microbiology: Recent Results (from the past 240 hour(s))  Culture, blood (routine x 2)     Status: None (Preliminary result)   Collection Time: 07/10/17  5:10 PM  Result Value Ref Range Status   Specimen Description   Final    BLOOD LEFT WRIST Performed at [facility] [address], [place], [place] [address]    Special Requests   Final    BOTTLES DRAWN AEROBIC AND ANAEROBIC Blood Culture adequate volume Performed at [facility] [address], [place], [place] [address]    Culture   Final    NO GROWTH 3 DAYS Performed at [facility], [place] [address] [place], [place] [address]    Report Status PENDING  Incomplete  Culture, blood (routine x 2)     Status: None (Preliminary result)   Collection Time: 07/10/17  5:35 PM  Result Value Ref Range Status   Specimen Description   Final    BLOOD LEFT FOREARM Performed at [facility] [address], [place], [place] [address]    Special Requests   Final    BOTTLES DRAWN AEROBIC AND ANAEROBIC Blood Culture adequate volume Performed at [facility] [address], [place], [place] [address]    Culture   Final    NO GROWTH 3 DAYS Performed at [facility], [place] [address] [place], [place] [address]    Report Status PENDING  Incomplete  Surgical pcr screen     Status:  None   Collection Time: 07/11/17  9:21 AM  Result Value Ref Range Status   MRSA, PCR NEGATIVE NEGATIVE Final   Staphylococcus aureus NEGATIVE NEGATIVE Final    Comment: (NOTE) The Xpert SA Assay (FDA approved for NASAL specimens in patients 17 years of age and older), is one component of a comprehensive surveillance program. It is not intended to diagnose infection nor to guide or monitor treatment. Performed at [facility], [place]  [address], [place], [place] [address]   Aerobic/Anaerobic Culture (surgical/deep wound)     Status: None (Preliminary result)   Collection Time: 07/11/17 10:16 AM  Result Value Ref Range Status   Specimen Description   Final    ABSCESS LEFT EYEBROW Performed at [facility] [address], [place], [place] [address]    Special Requests   Final    NONE Performed at [facility], [place] [address] [place], [place] [address]    Gram Stain   Final    FEW WBC PRESENT,BOTH PMN AND MONONUCLEAR NO ORGANISMS SEEN    Culture   Final    NO GROWTH 2 DAYS NO ANAEROBES ISOLATED; CULTURE IN PROGRESS FOR 5 DAYS Performed at [facility], [place] [address], [place], [place] [address]    Report Status PENDING  Incomplete     Labs: Basic Metabolic Panel: Recent Labs  Lab 07/10/17 1709 07/11/17 0523 07/12/17 0423  NA 137 137 135  K 3.8 3.9 3.6  CL 102 103 101  CO2 26 26 27   GLUCOSE 97 109* 129*  BUN 13 12 10   CREATININE 0.87 0.81 0.87  CALCIUM 9.2 8.7* 8.4*  MG  --  2.0  --   PHOS  --  3.2  --    Liver Function Tests: Recent Labs  Lab 07/10/17 1709 07/11/17 0523  AST 18 15  ALT 27 20  ALKPHOS 111 89  BILITOT 1.1 1.0  PROT 7.8 6.2*  ALBUMIN 4.1 3.3*   CBC: Recent Labs  Lab 07/10/17 1709 07/11/17 0523 07/12/17 0423  WBC 11.9* 10.3 10.1  NEUTROABS 8.7*  --   --   HGB 17.3* 16.0 13.9  HCT 49.7 48.1 43.0  MCV 91.0 92.5 93.3  PLT 254 220 207     IMAGING STUDIES Ct Orbits W Contrast  Result Date: 07/10/2017 CLINICAL DATA:  Infection of the left eye. EXAM: CT ORBITS WITH CONTRAST TECHNIQUE: Multidetector CT images was performed according to the standard protocol following intravenous contrast administration. CONTRAST:  33mL ISOVUE-300 IOPAMIDOL (ISOVUE-300) INJECTION 61% COMPARISON:  CT of the orbits 04/17/2003 FINDINGS: Orbits: Globes and postseptal orbits are within normal limits bilaterally. There is extensive soft tissue swelling  about the scratched at there is extensive periorbital soft tissue swelling about the left orbit. Visualized sinuses: The left frontal sinus is opacified. There is erosion of the anterior superior and inferolateral aspects of the sinus extension in communication into the subgaleal soft tissues. A gas collection is present within the left frontal scalp measuring 19 mm in transverse diameter. There is soft tissue opacification across the frontoethmoidal recess the into the anterior right frontal lobes. The left frontal sinus is also completely opacified. Destructive erosion is noted anterior and inferior and inferolateral with minimal gas evident along the posterior left maxillary alveolar ridge. The right frontal sinus is near completely opacified. No osseous destruction is evident. Anterior ethmoid air cells are opacified bilaterally. Significant left maxillary dental disease is present with a large dental caries in  the first left maxillary molar in the left canine tooth. There is significant lucency surrounding the roots of the left first canine molar. Soft tissues: Extensive left periorbital soft tissue swelling extends superiorly into the left frontal scalp with the aforementioned gas collection. Limited intracranial: There is no evidence for posterior osseous destruction in the left frontal sinus. No extra-axial empyema is evident. The visualized brain is within normal limits. IMPRESSION: 1. Left frontal sinus opacification with anterior wall osseous destruction and communication into the subgaleal soft tissues with extensive associated soft tissue swelling extending to the level of the orbit. Destruction 2. Additional osseous destruction inferiorly laterally in the left frontal sinus just above the left orbit. 3. Chronic left maxillary sinus opacification with osseous destruction inferiorly both anteriorly and laterally. A small gas collection is noted along the posterolateral maxillary alveolar ridge. 4. No  evidence for intracranial extension of infection from the left frontal sinus. 5. Diffuse anterior ethmoid and right frontal sinus disease without other osseous destruction. 6. Diffuse left periorbital soft tissue swelling without postseptal extension into the orbit. These results were called by telephone at the time of interpretation on 07/10/2017 at 1:51 pm to Dr. [name] , who verbally acknowledged these results. Electronically Signed   By: San Morelle M.D.   On: 07/10/2017 14:03    DISCHARGE EXAMINATION: Vitals:   07/12/17 1346 07/12/17 2102 07/13/17 0605 07/13/17 1059  BP: (!) 102/52 (!) 107/56 (!) 118/58   Pulse: 65 62 (!) 58 64  Resp: 17 17 17    Temp: 98.9 F (37.2 C) 99 F (37.2 C) 98.5 F (36.9 C)   TempSrc: Oral Oral Oral   SpO2: 98% 96% 95%   Weight:      Height:       General appearance: alert, cooperative, appears stated age and no distress Head: Improved swelling over the left forehead and eye.  Covered with dressing. Resp: clear to auscultation bilaterally Cardio: regular rate and rhythm, S1, S2 normal, no murmur, click, rub or gallop GI: soft, non-tender; bowel sounds normal; no masses,  no organomegaly  DISPOSITION: Home  Discharge Instructions    Call MD for:  difficulty breathing, headache or visual disturbances   Complete by:  As directed    Call MD for:  extreme fatigue   Complete by:  As directed    Call MD for:  persistant dizziness or light-headedness   Complete by:  As directed    Call MD for:  persistant nausea and vomiting   Complete by:  As directed    Call MD for:  severe uncontrolled pain   Complete by:  As directed    Call MD for:  temperature >100.4   Complete by:  As directed    Diet - low sodium heart healthy   Complete by:  As directed    Discharge instructions   Complete by:  As directed    Please be sure to follow-up with Dr. Redmond Baseman on Thursday.  The drain is to be left in for now.  You were cared for by a hospitalist  during your hospital stay. If you have any questions about your discharge medications or the care you received while you were in the hospital after you are discharged, you can call the unit and asked to speak with the hospitalist on call if the hospitalist that took care of you is not available. Once you are discharged, your primary care physician will handle any further medical issues. Please note that NO REFILLS for  any discharge medications will be authorized once you are discharged, as it is imperative that you return to your primary care physician (or establish a relationship with a primary care physician if you do not have one) for your aftercare needs so that they can reassess your need for medications and monitor your lab values. If you do not have a primary care physician, you can call [phone] for a physician referral.   Increase activity slowly   Complete by:  As directed         Allergies as of 07/13/2017   No Known Allergies     Medication List    STOP taking these medications   amoxicillin-clavulanate 500-125 MG tablet Commonly known as:  AUGMENTIN Replaced by:  amoxicillin-clavulanate 875-125 MG tablet   neomycin-polymyxin b-dexamethasone 3.5-10000-0.1 Susp Commonly known as:  MAXITROL   sulfamethoxazole-trimethoprim 800-160 MG tablet Commonly known as:  BACTRIM DS,SEPTRA DS     TAKE these medications   amoxicillin-clavulanate 875-125 MG tablet Commonly known as:  AUGMENTIN Take 1 tablet by mouth every 12 (twelve) hours for 10 days. Replaces:  amoxicillin-clavulanate 500-125 MG tablet   aspirin 325 MG EC tablet Take 1 tablet (325 mg total) by mouth daily. What changed:    how much to take  when to take this   atorvastatin 20 MG tablet Commonly known as:  LIPITOR Take 1 tablet (20 mg total) by mouth daily at 6 PM.   metoprolol tartrate 25 MG tablet Commonly known as:  LOPRESSOR Take 0.5 tablets (12.5 mg total) by mouth 2 (two) times daily.   oxyCODONE 5 MG  immediate release tablet Commonly known as:  Oxy IR/ROXICODONE Take 1 tablet (5 mg total) by mouth every 6 (six) hours as needed for breakthrough pain.   polyethylene glycol packet Commonly known as:  MIRALAX / GLYCOLAX Take 17 g by mouth daily as needed for moderate constipation.        Follow-up Information    Melida Quitter, MD. Call.   Specialty:  Otolaryngology Why:  to schedule appt for this thursday (March 28). Contact information: [address] [place] [address] [phone]           TOTAL DISCHARGE TIME: 35 mins  [facility] Pager [phone]  07/13/2017, 12:51 PM

## 2017-07-13 NOTE — Progress Notes (Signed)
Patient discharged to home with wife. Given all belongings, instructions, prescriptions. Wife present for all teaching. Both verbalized understanding of instructions.

## 2017-07-13 NOTE — Op Note (Signed)
NAME:  Sean Bridges, Sean Bridges NO.:  MEDICAL RECORD NO.:  [id number]  LOCATION:                                 FACILITY:  PHYSICIAN:  Onnie Graham, MD     DATE OF BIRTH:  August 08, 1945  DATE OF PROCEDURE:  07/11/2017 DATE OF DISCHARGE:                              OPERATIVE REPORT   PREOPERATIVE DIAGNOSES: 1. Left brow abscess. 2. Left Pott's puffy tumor. 3. Frontal sinus mucopyocele.  POSTOPERATIVE DIAGNOSES: 1. Left brow abscess. 2. Left Pott's puffy tumor. 3. Frontal sinus mucopyocele.  PROCEDURE:  Incision and drainage of left brow abscess.  SURGEON:  Onnie Graham, MD.  ANESTHESIA:  General LMA anesthesia.  COMPLICATIONS:  None.  INDICATION:  The patient is a 72 year old male, who has had recurring left periorbital edema and pain for the last several weeks that responds to antibiotics each time, but has worsened as well.  A CT scan demonstrated mucopyocele of the left frontal sinus with erosion through the anterior wall creating a soft tissue abscess anterior to the sinus. He presents to the operating room for drainage of the abscess.  FINDINGS:  Dissection from a trephination incision superiorly led to the abscess where yellow pus was drained.  Cultures were sent.  DESCRIPTION OF PROCEDURE:  The patient was identified in the holding room.  Informed consent having been obtained including discussion of risks, benefits, alternatives, the patient was brought to the operative suite and put on the operating table in supine position.  Anesthesia was induced.  The patient was then intubated by the Anesthesia team with an LMA without difficulty.  The patient was already receiving intravenous antibiotics.  The right eye was taped and the left eye lubricated.  The face was prepped and draped in sterile fashion.  The trephination incision was marked with a marking pen and injected with 1% lidocaine with 1:100,000 epinephrine.  The left eye was taped  closed with Steri- Strips.  Incision was made with a 15 blade scalpel through the skin and extended through subcutaneous tissues and muscle using Bovie electrocautery down toward the bone.  Dissection through the soft tissue then in a superior direction was performed bluntly first using a pair of scissors and then a hemostat gradually extending superiorly underneath the skin to the area of swelling in the left medial forehead.  Yellow pus was encountered.  The site was then copiously irrigated with saline using a red rubber catheter up into the abscess.  A portion of quarter- inch Penrose drain was then cut to thin it and then laid in through the tunnel to the abscess and secured to the skin using a single 4-0 nylon suture.  The incision was closed either side of the drain using 6-0 nylon suture in a simple interrupted fashion.  Drapes were removed and the patient was cleaned off.  A 4x4 dressing was folded and placed over the drain and secured with tape.  The patient was then returned to anesthesia for wake-up and was extubated in the recovery room in stable condition.     Onnie Graham, MD     DDB/MEDQ  D:  07/11/2017  T:  07/11/2017  Job:  [id number]  cc:   Onnie Graham, MD Fax: [phone]

## 2017-07-13 NOTE — Progress Notes (Signed)
   Subjective:    Patient ID: Sean Bridges, male    DOB: 1945-07-29, 72 y.o.   MRN: [id number]  HPI He is feeling much better today with less pain and swelling.  There is still drainage from the drain site.  Review of Systems     Objective:   Physical Exam AF VSS Alert, NAD Left eye covered with dry dressing.  Penrose drain in place.  Less periorbital edema.    Assessment & Plan:  Left frontal mucopyocele, Pott's puffy tumor s/p I&D, maxillary sinusitis  Discussed with hospitalist.  Discharge today on oral Augmentin.  He will follow-up with me Thursday for likely drain removal.  Endoscopic sinus surgery will then be planned 2-4 weeks thereafter.

## 2017-07-13 NOTE — Discharge Instructions (Signed)

## 2017-07-15 LAB — CULTURE, BLOOD (ROUTINE X 2)
CULTURE: NO GROWTH
CULTURE: NO GROWTH
SPECIAL REQUESTS: ADEQUATE
Special Requests: ADEQUATE

## 2017-07-16 DIAGNOSIS — M868X8 Other osteomyelitis, other site: Secondary | ICD-10-CM | POA: Diagnosis not present

## 2017-07-16 DIAGNOSIS — J322 Chronic ethmoidal sinusitis: Secondary | ICD-10-CM | POA: Diagnosis not present

## 2017-07-16 DIAGNOSIS — J321 Chronic frontal sinusitis: Secondary | ICD-10-CM | POA: Diagnosis not present

## 2017-07-16 DIAGNOSIS — J320 Chronic maxillary sinusitis: Secondary | ICD-10-CM | POA: Diagnosis not present

## 2017-07-18 LAB — AEROBIC/ANAEROBIC CULTURE (SURGICAL/DEEP WOUND)

## 2017-07-18 LAB — AEROBIC/ANAEROBIC CULTURE W GRAM STAIN (SURGICAL/DEEP WOUND)

## 2017-07-21 DIAGNOSIS — J329 Chronic sinusitis, unspecified: Secondary | ICD-10-CM | POA: Diagnosis not present

## 2017-07-21 DIAGNOSIS — J321 Chronic frontal sinusitis: Secondary | ICD-10-CM | POA: Diagnosis not present

## 2017-08-18 DIAGNOSIS — J324 Chronic pansinusitis: Secondary | ICD-10-CM | POA: Diagnosis not present

## 2017-08-18 DIAGNOSIS — M868X8 Other osteomyelitis, other site: Secondary | ICD-10-CM | POA: Diagnosis not present

## 2017-08-18 DIAGNOSIS — J321 Chronic frontal sinusitis: Secondary | ICD-10-CM | POA: Diagnosis not present

## 2017-08-18 DIAGNOSIS — J343 Hypertrophy of nasal turbinates: Secondary | ICD-10-CM | POA: Diagnosis not present

## 2017-08-18 DIAGNOSIS — J342 Deviated nasal septum: Secondary | ICD-10-CM | POA: Diagnosis not present

## 2017-08-18 DIAGNOSIS — Z87891 Personal history of nicotine dependence: Secondary | ICD-10-CM | POA: Diagnosis not present

## 2017-08-18 DIAGNOSIS — K089 Disorder of teeth and supporting structures, unspecified: Secondary | ICD-10-CM | POA: Diagnosis not present

## 2017-09-16 DIAGNOSIS — J324 Chronic pansinusitis: Secondary | ICD-10-CM | POA: Diagnosis not present

## 2017-09-16 DIAGNOSIS — K116 Mucocele of salivary gland: Secondary | ICD-10-CM | POA: Diagnosis not present

## 2017-09-16 DIAGNOSIS — M868X8 Other osteomyelitis, other site: Secondary | ICD-10-CM | POA: Diagnosis not present

## 2017-09-16 DIAGNOSIS — I2510 Atherosclerotic heart disease of native coronary artery without angina pectoris: Secondary | ICD-10-CM | POA: Diagnosis not present

## 2017-09-16 DIAGNOSIS — R946 Abnormal results of thyroid function studies: Secondary | ICD-10-CM | POA: Diagnosis not present

## 2017-09-16 DIAGNOSIS — Z87891 Personal history of nicotine dependence: Secondary | ICD-10-CM | POA: Diagnosis not present

## 2017-09-16 DIAGNOSIS — J342 Deviated nasal septum: Secondary | ICD-10-CM | POA: Diagnosis not present

## 2017-09-16 DIAGNOSIS — R7303 Prediabetes: Secondary | ICD-10-CM | POA: Diagnosis not present

## 2017-09-16 DIAGNOSIS — Z951 Presence of aortocoronary bypass graft: Secondary | ICD-10-CM | POA: Diagnosis not present

## 2017-09-21 DIAGNOSIS — J328 Other chronic sinusitis: Secondary | ICD-10-CM | POA: Diagnosis not present

## 2017-09-21 DIAGNOSIS — M868X8 Other osteomyelitis, other site: Secondary | ICD-10-CM | POA: Diagnosis not present

## 2017-09-21 DIAGNOSIS — J338 Other polyp of sinus: Secondary | ICD-10-CM | POA: Diagnosis not present

## 2017-09-21 DIAGNOSIS — J324 Chronic pansinusitis: Secondary | ICD-10-CM | POA: Diagnosis not present

## 2017-09-21 DIAGNOSIS — J341 Cyst and mucocele of nose and nasal sinus: Secondary | ICD-10-CM | POA: Diagnosis not present

## 2017-09-21 DIAGNOSIS — J342 Deviated nasal septum: Secondary | ICD-10-CM | POA: Diagnosis not present

## 2017-09-27 ENCOUNTER — Emergency Department (HOSPITAL_COMMUNITY): Payer: PPO

## 2017-09-27 ENCOUNTER — Inpatient Hospital Stay (HOSPITAL_COMMUNITY): Payer: PPO

## 2017-09-27 ENCOUNTER — Encounter (HOSPITAL_COMMUNITY): Payer: Self-pay | Admitting: Emergency Medicine

## 2017-09-27 ENCOUNTER — Other Ambulatory Visit: Payer: Self-pay

## 2017-09-27 ENCOUNTER — Observation Stay (HOSPITAL_COMMUNITY)
Admission: EM | Admit: 2017-09-27 | Discharge: 2017-09-28 | Disposition: A | Discharge status: Home / Self Care | Payer: PPO | Attending: Internal Medicine | Admitting: Internal Medicine

## 2017-09-27 DIAGNOSIS — H5462 Unqualified visual loss, left eye, normal vision right eye: Secondary | ICD-10-CM | POA: Diagnosis not present

## 2017-09-27 DIAGNOSIS — Z9889 Other specified postprocedural states: Secondary | ICD-10-CM

## 2017-09-27 DIAGNOSIS — R531 Weakness: Secondary | ICD-10-CM | POA: Diagnosis not present

## 2017-09-27 DIAGNOSIS — E860 Dehydration: Secondary | ICD-10-CM | POA: Diagnosis not present

## 2017-09-27 DIAGNOSIS — Z79899 Other long term (current) drug therapy: Secondary | ICD-10-CM | POA: Insufficient documentation

## 2017-09-27 DIAGNOSIS — F17210 Nicotine dependence, cigarettes, uncomplicated: Secondary | ICD-10-CM | POA: Insufficient documentation

## 2017-09-27 DIAGNOSIS — E871 Hypo-osmolality and hyponatremia: Secondary | ICD-10-CM | POA: Insufficient documentation

## 2017-09-27 DIAGNOSIS — I252 Old myocardial infarction: Secondary | ICD-10-CM | POA: Insufficient documentation

## 2017-09-27 DIAGNOSIS — I10 Essential (primary) hypertension: Secondary | ICD-10-CM

## 2017-09-27 DIAGNOSIS — Z973 Presence of spectacles and contact lenses: Secondary | ICD-10-CM | POA: Insufficient documentation

## 2017-09-27 DIAGNOSIS — N179 Acute kidney failure, unspecified: Secondary | ICD-10-CM

## 2017-09-27 DIAGNOSIS — Z8249 Family history of ischemic heart disease and other diseases of the circulatory system: Secondary | ICD-10-CM | POA: Insufficient documentation

## 2017-09-27 DIAGNOSIS — I2510 Atherosclerotic heart disease of native coronary artery without angina pectoris: Secondary | ICD-10-CM | POA: Diagnosis not present

## 2017-09-27 DIAGNOSIS — R112 Nausea with vomiting, unspecified: Secondary | ICD-10-CM | POA: Insufficient documentation

## 2017-09-27 DIAGNOSIS — Z951 Presence of aortocoronary bypass graft: Secondary | ICD-10-CM | POA: Insufficient documentation

## 2017-09-27 DIAGNOSIS — E785 Hyperlipidemia, unspecified: Secondary | ICD-10-CM | POA: Diagnosis not present

## 2017-09-27 DIAGNOSIS — J449 Chronic obstructive pulmonary disease, unspecified: Secondary | ICD-10-CM | POA: Diagnosis not present

## 2017-09-27 DIAGNOSIS — R339 Retention of urine, unspecified: Secondary | ICD-10-CM

## 2017-09-27 DIAGNOSIS — K5900 Constipation, unspecified: Secondary | ICD-10-CM | POA: Insufficient documentation

## 2017-09-27 DIAGNOSIS — Z7982 Long term (current) use of aspirin: Secondary | ICD-10-CM | POA: Diagnosis not present

## 2017-09-27 DIAGNOSIS — R338 Other retention of urine: Secondary | ICD-10-CM

## 2017-09-27 DIAGNOSIS — Z9049 Acquired absence of other specified parts of digestive tract: Secondary | ICD-10-CM | POA: Insufficient documentation

## 2017-09-27 DIAGNOSIS — R1111 Vomiting without nausea: Secondary | ICD-10-CM | POA: Diagnosis not present

## 2017-09-27 HISTORY — DX: Acute kidney failure, unspecified: N17.9

## 2017-09-27 LAB — COMPREHENSIVE METABOLIC PANEL
ALBUMIN: 3.6 g/dL (ref 3.5–5.0)
ALK PHOS: 57 U/L (ref 38–126)
ALT: 14 U/L — ABNORMAL LOW (ref 17–63)
ANION GAP: 16 — AB (ref 5–15)
AST: 14 U/L — ABNORMAL LOW (ref 15–41)
BUN: 131 mg/dL — ABNORMAL HIGH (ref 6–20)
CALCIUM: 8.6 mg/dL — AB (ref 8.9–10.3)
CHLORIDE: 96 mmol/L — AB (ref 101–111)
CO2: 21 mmol/L — AB (ref 22–32)
Creatinine, Ser: 11.73 mg/dL — ABNORMAL HIGH (ref 0.61–1.24)
GFR calc non Af Amer: 4 mL/min — ABNORMAL LOW (ref >60)
GFR, EST AFRICAN AMERICAN: 4 mL/min — AB (ref >60)
GLUCOSE: 115 mg/dL — AB (ref 65–99)
Potassium: 4.3 mmol/L (ref 3.5–5.1)
SODIUM: 133 mmol/L — AB (ref 135–145)
Total Bilirubin: 0.6 mg/dL (ref 0.3–1.2)
Total Protein: 6.6 g/dL (ref 6.5–8.1)

## 2017-09-27 LAB — URINALYSIS, ROUTINE W REFLEX MICROSCOPIC
BILIRUBIN URINE: NEGATIVE
Glucose, UA: NEGATIVE mg/dL
Ketones, ur: NEGATIVE mg/dL
Leukocytes, UA: NEGATIVE
Nitrite: NEGATIVE
Protein, ur: 30 mg/dL — AB
SPECIFIC GRAVITY, URINE: 1.008 (ref 1.005–1.030)
pH: 5 (ref 5.0–8.0)

## 2017-09-27 LAB — CBC
HEMATOCRIT: 42 % (ref 39.0–52.0)
HEMOGLOBIN: 14.8 g/dL (ref 13.0–17.0)
MCH: 31.1 pg (ref 26.0–34.0)
MCHC: 35.2 g/dL (ref 30.0–36.0)
MCV: 88.2 fL (ref 78.0–100.0)
Platelets: 197 10*3/uL (ref 150–400)
RBC: 4.76 MIL/uL (ref 4.22–5.81)
RDW: 13.6 % (ref 11.5–15.5)
WBC: 8.6 10*3/uL (ref 4.0–10.5)

## 2017-09-27 LAB — LIPASE, BLOOD: LIPASE: 58 U/L — AB (ref 11–51)

## 2017-09-27 MED ORDER — ONDANSETRON HCL 4 MG/2ML IJ SOLN: 4.0000 mg | Freq: Three times a day (TID) | INTRAMUSCULAR | Status: DC | PRN

## 2017-09-27 MED ORDER — ONDANSETRON HCL 4 MG/2ML IJ SOLN
4.0000 mg | Freq: Once | INTRAMUSCULAR | Status: AC | PRN
Start: 2017-09-27 — End: 2017-09-27
  Administered 2017-09-27: 4 mg via INTRAVENOUS
  Filled 2017-09-27: qty 2

## 2017-09-27 MED ORDER — SODIUM CHLORIDE 0.9 % IV SOLN
INTRAVENOUS | Status: DC
  Administered 2017-09-27 – 2017-09-28 (×2): via INTRAVENOUS

## 2017-09-27 MED ORDER — ACETAMINOPHEN 325 MG PO TABS
650.0000 mg | ORAL_TABLET | Freq: Four times a day (QID) | ORAL | Status: DC | PRN
  Filled 2017-09-27: qty 2

## 2017-09-27 MED ORDER — ASPIRIN EC 81 MG PO TBEC
81.0000 mg | DELAYED_RELEASE_TABLET | Freq: Every day | ORAL | Status: DC
  Administered 2017-09-27 – 2017-09-28 (×2): 81 mg via ORAL
  Filled 2017-09-27 (×2): qty 1

## 2017-09-27 MED ORDER — POLYETHYLENE GLYCOL 3350 17 G PO PACK
17.0000 g | PACK | Freq: Every day | ORAL | Status: DC
  Administered 2017-09-27 – 2017-09-28 (×2): 17 g via ORAL
  Filled 2017-09-27 (×2): qty 1

## 2017-09-27 MED ORDER — TAMSULOSIN HCL 0.4 MG PO CAPS
0.4000 mg | ORAL_CAPSULE | Freq: Every day | ORAL | Status: DC
Start: 2017-09-27 — End: 2017-09-28
  Administered 2017-09-27 – 2017-09-28 (×2): 0.4 mg via ORAL
  Filled 2017-09-27 (×2): qty 1

## 2017-09-27 MED ORDER — METOPROLOL TARTRATE 25 MG PO TABS
12.5000 mg | ORAL_TABLET | Freq: Two times a day (BID) | ORAL | Status: DC
  Administered 2017-09-28: 12.5 mg via ORAL
  Filled 2017-09-27 (×2): qty 1

## 2017-09-27 MED ORDER — ATORVASTATIN CALCIUM 20 MG PO TABS
20.0000 mg | ORAL_TABLET | Freq: Every day | ORAL | Status: DC
Start: 2017-09-27 — End: 2017-09-28
  Administered 2017-09-27: 20 mg via ORAL
  Filled 2017-09-27: qty 1

## 2017-09-27 NOTE — ED Triage Notes (Signed)
Pt had sinus surgery on Tuesday last week. Reports that on Wed started with vomiting and fatigue.  Pt reports that he feels dehydrated. Denies diarrhea but passing gas. "im an having hard time going number one right now, it's just dripping".
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
## 2017-09-27 NOTE — Progress Notes (Deleted)
Bed alarmed. NT and RN to room. Son and young women at bedside with patient turned to side, assessing skin. Offered them assist yet declined. Reassurance given to call if needed.
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
## 2017-09-28 DIAGNOSIS — I10 Essential (primary) hypertension: Secondary | ICD-10-CM

## 2017-09-28 DIAGNOSIS — I2510 Atherosclerotic heart disease of native coronary artery without angina pectoris: Secondary | ICD-10-CM

## 2017-09-28 DIAGNOSIS — N179 Acute kidney failure, unspecified: Secondary | ICD-10-CM

## 2017-09-28 DIAGNOSIS — Z9889 Other specified postprocedural states: Secondary | ICD-10-CM

## 2017-09-28 DIAGNOSIS — R338 Other retention of urine: Secondary | ICD-10-CM

## 2017-09-28 LAB — BASIC METABOLIC PANEL
Anion gap: 6 (ref 5–15)
BUN: 37 mg/dL — AB (ref 6–20)
CHLORIDE: 111 mmol/L (ref 101–111)
CO2: 27 mmol/L (ref 22–32)
Calcium: 8.5 mg/dL — ABNORMAL LOW (ref 8.9–10.3)
Creatinine, Ser: 1.74 mg/dL — ABNORMAL HIGH (ref 0.61–1.24)
GFR calc Af Amer: 44 mL/min — ABNORMAL LOW (ref >60)
GFR calc non Af Amer: 38 mL/min — ABNORMAL LOW (ref >60)
GLUCOSE: 104 mg/dL — AB (ref 65–99)
POTASSIUM: 4.5 mmol/L (ref 3.5–5.1)
SODIUM: 144 mmol/L (ref 135–145)

## 2017-09-28 LAB — URINE CULTURE: CULTURE: NO GROWTH

## 2017-09-28 LAB — HIV ANTIBODY (ROUTINE TESTING W REFLEX): HIV Screen 4th Generation wRfx: NONREACTIVE

## 2017-09-28 MED ORDER — TAMSULOSIN HCL 0.4 MG PO CAPS: 0.4000 mg | ORAL_CAPSULE | Freq: Every day | ORAL | 0 refills | Status: DC

## 2017-09-28 NOTE — Discharge Summary (Signed)
Physician Discharge Summary  Sean Bridges ZOX:[id number] DOB: January 25, 1946 DOA: 09/27/2017  PCP: [name], MD  Admit date: 09/27/2017 Discharge date: 09/28/2017  Admitted From: Home Disposition:  Home  Recommendations for Outpatient Follow-up:  1. Follow up with PCP in 1 week with repeat BMP 2. Follow-up with [facility] in a week.  Patient had seen Dr. [name] in the past   Home Health: No  Equipment/Devices: Foley catheter  Discharge Condition: Stable CODE STATUS: Full Diet recommendation: Heart Healthy   Brief/Interim Summary: 72 year old male with history of coronary artery disease status post CABG, hyperlipidemia, hypertension, sinus opacification status post I&D of frontal mucopyocele by ENT in March 2019 and recent endoscopic sinus surgery on September 21, 2017 by ENT presented on 09/27/2017 with generalized weakness, nausea, vomiting.  He was found to have acute kidney injury with creatinine of 11.73.  Foley catheter was placed.  He drained urine well.  His creatinine has improved to 1.74 today.  He will be discharged home with a Foley catheter with outpatient follow-up with urology.   Discharge Diagnoses:  Principal Problem:   AKI (acute kidney injury) ([place]) Active Problems:   CAD (coronary artery disease), native coronary artery   H/O endoscopic sinus surgery   Urine retention   Constipation   HTN (hypertension)   HLD (hyperlipidemia)  Acute kidney injury -Probably secondary to urinary retention -Status post Foley catheter placement.  Creatinine on admission was 11.73.  This morning's creatinine is 1.74. -Continue tamsulosin which has been started on admission -Renal ultrasound was negative for hydronephrosis.  CT abdomen and pelvis showed mild bilateral hydroureter with perinephric stranding.  UA was negative for UTI.  Since patient's creatinine improved markedly after Foley catheter placement, I do not think that patient needs any antibiotic treatment.  Spoke to Dr.  McKenzie/urology on phone and he stated that patient will need outpatient follow-up with urology within a week. -Discharge patient home with Foley catheter.  Coronary artery disease status post CABG -Follow-up with cardiology as an outpatient.  Currently stable.  Continue home meds including statin and beta-blocker  Hypertension -Blood pressure stable.  Continue metoprolol  Hyperlipidemia -Continue statin  History of sinus opacification/frontal mucopyocele and recent endoscopic sinus surgery -Outpatient follow-up with ENT  Mild hyponatremia -Resolved with IV fluids.    Discharge Instructions  Discharge Instructions    Ambulatory referral to Urology   Complete by:  As directed    Urinary retention/AKI.  Patient had seen Dr. [name] in the past   Call MD for:  difficulty breathing, headache or visual disturbances   Complete by:  As directed    Call MD for:  extreme fatigue   Complete by:  As directed    Call MD for:  hives   Complete by:  As directed    Call MD for:  persistant dizziness or light-headedness   Complete by:  As directed    Call MD for:  persistant nausea and vomiting   Complete by:  As directed    Call MD for:  severe uncontrolled pain   Complete by:  As directed    Call MD for:  temperature >100.4   Complete by:  As directed    Diet - low sodium heart healthy   Complete by:  As directed    Increase activity slowly   Complete by:  As directed      Allergies as of 09/28/2017   No Known Allergies     Medication List    STOP taking these medications  amoxicillin-clavulanate 875-125 MG tablet Commonly known as:  AUGMENTIN   HYDROcodone-acetaminophen 5-325 MG tablet Commonly known as:  NORCO/VICODIN   methylPREDNISolone 4 MG Tbpk tablet Commonly known as:  MEDROL DOSEPAK   oxyCODONE 5 MG immediate release tablet Commonly known as:  Oxy IR/ROXICODONE   polyethylene glycol packet Commonly known as:  MIRALAX / GLYCOLAX     TAKE these medications    aspirin EC 81 MG tablet Take 81 mg by mouth daily. What changed:  Another medication with the same name was removed. Continue taking this medication, and follow the directions you see here.   atorvastatin 20 MG tablet Commonly known as:  LIPITOR Take 1 tablet (20 mg total) by mouth daily at 6 PM.   metoprolol tartrate 25 MG tablet Commonly known as:  LOPRESSOR Take 0.5 tablets (12.5 mg total) by mouth 2 (two) times daily.   ondansetron 4 MG tablet Commonly known as:  ZOFRAN Take 4 mg by mouth 2 (two) times daily as needed for nausea/vomiting.   SINUS RINSE Pack Place into the nose.   tamsulosin 0.4 MG Caps capsule Commonly known as:  FLOMAX Take 1 capsule (0.4 mg total) by mouth daily.      Follow-up Information    [name], MD. Schedule an appointment as soon as possible for a visit in 1 week(s).   Specialty:  Internal Medicine Why:  with repeat CBC/BMP Contact information: [place] [address] [phone]        Alexis Frock, MD Follow up.   Specialty:  Urology Why:  call to make appointment within a week.  Patient had seen Dr. [name] in the past. Contact information: [place]  [address] [phone]          No Known Allergies  Consultations:  Spoke with Dr. McKenzie/urology on phone   Procedures/Studies: Ct Abdomen Pelvis Wo Contrast  Result Date: 09/27/2017 CLINICAL DATA:  Nausea, vomiting, acute renal failure EXAM: CT ABDOMEN AND PELVIS WITHOUT CONTRAST TECHNIQUE: Multidetector CT imaging of the abdomen and pelvis was performed following the standard protocol without IV contrast. COMPARISON:  July 20, 2013 FINDINGS: Lower chest: Minimal atelectasis of bilateral posterior lung bases are noted. The heart size is normal. Hepatobiliary: No focal liver abnormality is seen. Status post cholecystectomy. No biliary dilatation. Pancreas: Unremarkable. No pancreatic ductal dilatation or surrounding  inflammatory changes. Spleen: Normal in size without focal abnormality. Adrenals/Urinary Tract: The adrenal glands are normal. Bilateral perinephric stranding is identified. Mild bilateral hydroureter with surrounding inflammation is noted. No focal discrete obstructing stone is identified in the bilateral collecting systems. There are no kidney stones. A Foley catheter is identified in the bladder. Stomach/Bowel: Stomach is within normal limits. Appendix appears normal. No evidence of bowel wall thickening, distention, or inflammatory changes. Vascular/Lymphatic: Aortic atherosclerosis. No enlarged abdominal or pelvic lymph nodes. Reproductive: Prostate calcifications are noted. Prostate gland is enlarged measuring 5.8 cm. Other: None. Musculoskeletal: No acute or significant osseous findings. IMPRESSION: Bilateral perinephric standing is identified. Mild bilateral hydroureter with surrounding inflammation is noted. No focal discrete obstructing stone is noted in bilateral collecting systems. The finding is nonspecific but pyelonephritis is not excluded. Electronically Signed   By: Abelardo Diesel M.D.   On: 09/27/2017 14:13   US Renal  Result Date: 09/27/2017 CLINICAL DATA:  Acute kidney injury EXAM: RENAL / URINARY TRACT ULTRASOUND COMPLETE COMPARISON:  09/27/2017 CT abdomen/pelvis FINDINGS: Right Kidney: Length: 12.7 cm. Normal right renal parenchymal echogenicity and thickness. No right hydronephrosis.  Simple 1.7 cm upper right renal cyst. Suggestion of a nonobstructing 7 x 3 mm upper right renal stone. Left Kidney: Length: 12.5 cm. Normal left renal parenchymal echogenicity and thickness. No left hydronephrosis. No left renal mass. Bladder: Bladder collapsed by indwelling catheter and not evaluated on this scan. IMPRESSION: 1. No hydronephrosis. 2. Small simple upper right renal cyst. 3. Possible nonobstructing upper right nephrolithiasis. 4. Bladder collapsed by indwelling Foley catheter and not evaluated on  this scan. Electronically Signed   By: Ilona Sorrel M.D.   On: 09/27/2017 15:20      Subjective: Patient seen and examined at bedside.  He feels much better but is still weak.  No overnight fever or vomiting.  He wants to go home  Discharge Exam: Vitals:   09/27/17 2138 09/28/17 0601  BP: 118/61 133/78  Pulse: (!) 51 65  Resp: 14 14  Temp: 98.7 F (37.1 C) 97.9 F (36.6 C)  SpO2: 94% 96%   Vitals:   09/27/17 1421 09/27/17 1520 09/27/17 2138 09/28/17 0601  BP: (!) 125/59 (!) 106/49 118/61 133/78  Pulse: (!) 58 (!) 50 (!) 51 65  Resp: 16 18 14 14   Temp:  98.2 F (36.8 C) 98.7 F (37.1 C) 97.9 F (36.6 C)  TempSrc:  Oral Oral Oral  SpO2: 95% 95% 94% 96%  Weight:  74.8 kg (165 lb)    Height:  5' 8.5" (1.74 m)      General: Pt is alert, awake, not in acute distress Cardiovascular: Rate controlled, S1/S2 + Respiratory: Bilateral decreased breath sounds at bases Abdominal: Soft, NT, ND, bowel sounds + Extremities: no edema, no cyanosis    The results of significant diagnostics from this hospitalization (including imaging, microbiology, ancillary and laboratory) are listed below for reference.     Microbiology: No results found for this or any previous visit (from the past 240 hour(s)).   Labs: BNP (last 3 results) No results for input(s): BNP in the last 8760 hours. Basic Metabolic Panel: Recent Labs  Lab 09/27/17 0954 09/28/17 0351  NA 133* 144  K 4.3 4.5  CL 96* 111  CO2 21* 27  GLUCOSE 115* 104*  BUN 131* 37*  CREATININE 11.73* 1.74*  CALCIUM 8.6* 8.5*   Liver Function Tests: Recent Labs  Lab 09/27/17 0954  AST 14*  ALT 14*  ALKPHOS 57  BILITOT 0.6  PROT 6.6  ALBUMIN 3.6   Recent Labs  Lab 09/27/17 0954  LIPASE 58*   No results for input(s): AMMONIA in the last 168 hours. CBC: Recent Labs  Lab 09/27/17 0954  WBC 8.6  HGB 14.8  HCT 42.0  MCV 88.2  PLT 197   Cardiac Enzymes: No results for input(s): CKTOTAL, CKMB, CKMBINDEX,  TROPONINI in the last 168 hours. BNP: Invalid input(s): POCBNP CBG: No results for input(s): GLUCAP in the last 168 hours. D-Dimer No results for input(s): DDIMER in the last 72 hours. Hgb A1c No results for input(s): HGBA1C in the last 72 hours. Lipid Profile No results for input(s): CHOL, HDL, LDLCALC, TRIG, CHOLHDL, LDLDIRECT in the last 72 hours. Thyroid function studies No results for input(s): TSH, T4TOTAL, T3FREE, THYROIDAB in the last 72 hours.  Invalid input(s): FREET3 Anemia work up No results for input(s): VITAMINB12, FOLATE, FERRITIN, TIBC, IRON, RETICCTPCT in the last 72 hours. Urinalysis    Component Value Date/Time   COLORURINE YELLOW 09/27/2017 0954   APPEARANCEUR HAZY (A) 09/27/2017 0954   LABSPEC 1.008 09/27/2017 0954   PHURINE 5.0 09/27/2017 2671  GLUCOSEU NEGATIVE 09/27/2017 0954   HGBUR LARGE (A) 09/27/2017 [place] NEGATIVE 09/27/2017 [place] 09/27/2017 0954   PROTEINUR 30 (A) 09/27/2017 0954   NITRITE NEGATIVE 09/27/2017 0954   LEUKOCYTESUR NEGATIVE 09/27/2017 0954   Sepsis Labs Invalid input(s): PROCALCITONIN,  WBC,  LACTICIDVEN Microbiology No results found for this or any previous visit (from the past 240 hour(s)).   Time coordinating discharge: 35 minutes  SIGNED:   Aline August, MD  [facility] 09/28/2017, 9:33 AM Pager: [phone]  If 7PM-7AM, please contact night-coverage [email or web address] Password [email or web address]

## 2017-09-28 NOTE — Progress Notes (Signed)
RN discussed discharge paperwork with pt and wife.  Foley drainage bag switched to leg back, and pt and wife were taught how to switch, empty, and maintain catheter bag for home use.  They demonstrated understanding. Pt was escorted to main lobby in stable condition.

## 2017-09-28 NOTE — Care Management Obs Status (Signed)
[place] NOTIFICATION   Patient Details  Name: Sean Bridges MRN: [id number] Date of Birth: 08-Jun-1945   Medicare Observation Status Notification Given:  Yes    Guadalupe Maple, RN 09/28/2017, 11:47 AM

## 2017-09-28 NOTE — Care Management CC44 (Signed)
Condition Code 44 Documentation Completed  Patient Details  Name: Sean Bridges MRN: [id number] Date of Birth: 1946/02/13   Condition Code 44 given:  Yes Patient signature on Condition Code 44 notice:  Yes Documentation of 2 MD's agreement:  Yes Code 44 added to claim:  Yes    Guadalupe Maple, RN 09/28/2017, 11:47 AM

## 2017-09-29 DIAGNOSIS — Z48810 Encounter for surgical aftercare following surgery on the sense organs: Secondary | ICD-10-CM | POA: Diagnosis not present

## 2017-09-29 DIAGNOSIS — J341 Cyst and mucocele of nose and nasal sinus: Secondary | ICD-10-CM | POA: Diagnosis not present

## 2017-09-29 DIAGNOSIS — Z79899 Other long term (current) drug therapy: Secondary | ICD-10-CM | POA: Diagnosis not present

## 2017-09-29 DIAGNOSIS — Z09 Encounter for follow-up examination after completed treatment for conditions other than malignant neoplasm: Secondary | ICD-10-CM | POA: Diagnosis not present

## 2017-09-29 DIAGNOSIS — J449 Chronic obstructive pulmonary disease, unspecified: Secondary | ICD-10-CM | POA: Diagnosis not present

## 2017-09-29 DIAGNOSIS — J329 Chronic sinusitis, unspecified: Secondary | ICD-10-CM | POA: Diagnosis not present

## 2017-09-29 DIAGNOSIS — M868X8 Other osteomyelitis, other site: Secondary | ICD-10-CM | POA: Diagnosis not present

## 2017-09-29 DIAGNOSIS — J324 Chronic pansinusitis: Secondary | ICD-10-CM | POA: Diagnosis not present

## 2017-09-29 DIAGNOSIS — K5900 Constipation, unspecified: Secondary | ICD-10-CM | POA: Diagnosis not present

## 2017-09-29 DIAGNOSIS — I2510 Atherosclerotic heart disease of native coronary artery without angina pectoris: Secondary | ICD-10-CM | POA: Diagnosis not present

## 2017-10-02 DIAGNOSIS — Z125 Encounter for screening for malignant neoplasm of prostate: Secondary | ICD-10-CM | POA: Diagnosis not present

## 2017-10-02 DIAGNOSIS — I2510 Atherosclerotic heart disease of native coronary artery without angina pectoris: Secondary | ICD-10-CM | POA: Diagnosis not present

## 2017-10-02 DIAGNOSIS — R7303 Prediabetes: Secondary | ICD-10-CM | POA: Diagnosis not present

## 2017-10-02 DIAGNOSIS — E782 Mixed hyperlipidemia: Secondary | ICD-10-CM | POA: Diagnosis not present

## 2017-10-02 DIAGNOSIS — N179 Acute kidney failure, unspecified: Secondary | ICD-10-CM | POA: Diagnosis not present

## 2017-10-02 DIAGNOSIS — Z09 Encounter for follow-up examination after completed treatment for conditions other than malignant neoplasm: Secondary | ICD-10-CM | POA: Diagnosis not present

## 2017-10-02 DIAGNOSIS — N390 Urinary tract infection, site not specified: Secondary | ICD-10-CM | POA: Diagnosis not present

## 2017-10-02 DIAGNOSIS — R5383 Other fatigue: Secondary | ICD-10-CM | POA: Diagnosis not present

## 2017-10-06 DIAGNOSIS — Z466 Encounter for fitting and adjustment of urinary device: Secondary | ICD-10-CM | POA: Diagnosis not present

## 2017-10-06 DIAGNOSIS — R339 Retention of urine, unspecified: Secondary | ICD-10-CM | POA: Diagnosis not present

## 2017-10-07 DIAGNOSIS — R339 Retention of urine, unspecified: Secondary | ICD-10-CM | POA: Diagnosis not present

## 2017-10-08 ENCOUNTER — Emergency Department (HOSPITAL_COMMUNITY)
Admission: EM | Admit: 2017-10-08 | Discharge: 2017-10-08 | Disposition: A | Discharge status: Home / Self Care | Payer: PPO | Attending: Emergency Medicine | Admitting: Emergency Medicine

## 2017-10-08 ENCOUNTER — Encounter (HOSPITAL_COMMUNITY): Payer: Self-pay

## 2017-10-08 ENCOUNTER — Other Ambulatory Visit: Payer: Self-pay

## 2017-10-08 DIAGNOSIS — Z79899 Other long term (current) drug therapy: Secondary | ICD-10-CM | POA: Insufficient documentation

## 2017-10-08 DIAGNOSIS — R310 Gross hematuria: Secondary | ICD-10-CM

## 2017-10-08 DIAGNOSIS — J449 Chronic obstructive pulmonary disease, unspecified: Secondary | ICD-10-CM | POA: Insufficient documentation

## 2017-10-08 DIAGNOSIS — I10 Essential (primary) hypertension: Secondary | ICD-10-CM | POA: Diagnosis not present

## 2017-10-08 DIAGNOSIS — I2510 Atherosclerotic heart disease of native coronary artery without angina pectoris: Secondary | ICD-10-CM | POA: Diagnosis not present

## 2017-10-08 DIAGNOSIS — Z951 Presence of aortocoronary bypass graft: Secondary | ICD-10-CM | POA: Insufficient documentation

## 2017-10-08 DIAGNOSIS — F17210 Nicotine dependence, cigarettes, uncomplicated: Secondary | ICD-10-CM | POA: Insufficient documentation

## 2017-10-08 DIAGNOSIS — R339 Retention of urine, unspecified: Secondary | ICD-10-CM | POA: Insufficient documentation

## 2017-10-08 DIAGNOSIS — Z7982 Long term (current) use of aspirin: Secondary | ICD-10-CM | POA: Diagnosis not present

## 2017-10-08 DIAGNOSIS — R319 Hematuria, unspecified: Secondary | ICD-10-CM | POA: Diagnosis present

## 2017-10-08 DIAGNOSIS — R109 Unspecified abdominal pain: Secondary | ICD-10-CM | POA: Diagnosis not present

## 2017-10-08 LAB — BASIC METABOLIC PANEL
ANION GAP: 9 (ref 5–15)
BUN: 15 mg/dL (ref 6–20)
CALCIUM: 8.8 mg/dL — AB (ref 8.9–10.3)
CHLORIDE: 106 mmol/L (ref 101–111)
CO2: 24 mmol/L (ref 22–32)
CREATININE: 0.89 mg/dL (ref 0.61–1.24)
GFR calc Af Amer: >60 mL/min (ref >60)
GFR calc non Af Amer: >60 mL/min (ref >60)
Glucose, Bld: 137 mg/dL — ABNORMAL HIGH (ref 65–99)
Potassium: 3.9 mmol/L (ref 3.5–5.1)
SODIUM: 139 mmol/L (ref 135–145)

## 2017-10-08 LAB — CBC WITH DIFFERENTIAL/PLATELET
BASOS ABS: 0 10*3/uL (ref 0.0–0.1)
BASOS PCT: 0 %
EOS ABS: 0.3 10*3/uL (ref 0.0–0.7)
Eosinophils Relative: 3 %
HEMATOCRIT: 43 % (ref 39.0–52.0)
HEMOGLOBIN: 14.6 g/dL (ref 13.0–17.0)
Lymphocytes Relative: 20 %
Lymphs Abs: 1.7 10*3/uL (ref 0.7–4.0)
MCH: 31.3 pg (ref 26.0–34.0)
MCHC: 34 g/dL (ref 30.0–36.0)
MCV: 92.1 fL (ref 78.0–100.0)
MONOS PCT: 7 %
Monocytes Absolute: 0.6 10*3/uL (ref 0.1–1.0)
NEUTROS ABS: 6.3 10*3/uL (ref 1.7–7.7)
NEUTROS PCT: 70 %
Platelets: 239 10*3/uL (ref 150–400)
RBC: 4.67 MIL/uL (ref 4.22–5.81)
RDW: 13.7 % (ref 11.5–15.5)
WBC: 8.9 10*3/uL (ref 4.0–10.5)

## 2017-10-08 LAB — URINALYSIS, ROUTINE W REFLEX MICROSCOPIC

## 2017-10-08 LAB — URINALYSIS, MICROSCOPIC (REFLEX): BACTERIA UA: NONE SEEN

## 2017-10-08 NOTE — ED Triage Notes (Addendum)
Pt reports difficulty urinating. He has been self-cathing at home and stated that his last 3 voids have been thick blood. He had sinus surgery last week and states that his bladder problems started after the sinus surgery. A&Ox4. Ambulatory.

## 2017-10-08 NOTE — ED Notes (Signed)
Foley cath irrigated with 1000cc sterile water and return is slightly pink, patient requested no further irrigation stating it felt uncomfortable. No blood clots noted.

## 2017-10-08 NOTE — ED Provider Notes (Signed)
  Face-to-face evaluation   History: He presents for inability to catheterize himself after noting bloody urine when he cath himself last night.  Recent hospitalization for acute urinary outlet obstruction with acute renal failure.  Since discharge several days ago he has been self cathing without problems until this morning.  Physical exam: Alert elderly man.  He is comfortable.  No respiratory distress.  He is lucid.  Medical screening examination/treatment/procedure(s) were conducted as a shared visit with non-physician practitioner(s) and myself.  I personally evaluated the patient during the encounter   Daleen Bo, MD 10/12/17 1531

## 2017-10-08 NOTE — ED Notes (Signed)
Leg bag provided to patient

## 2017-10-08 NOTE — Discharge Instructions (Addendum)
Follow up with your urologist tomorrow. Return here with any high fever, decreased drainage into the urinary bag or recurrent abdominal pain, or for new concern.
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
## 2017-10-14 DIAGNOSIS — J342 Deviated nasal septum: Secondary | ICD-10-CM | POA: Diagnosis not present

## 2017-10-14 DIAGNOSIS — Z79899 Other long term (current) drug therapy: Secondary | ICD-10-CM | POA: Diagnosis not present

## 2017-10-14 DIAGNOSIS — J341 Cyst and mucocele of nose and nasal sinus: Secondary | ICD-10-CM | POA: Diagnosis not present

## 2017-10-14 DIAGNOSIS — Z7982 Long term (current) use of aspirin: Secondary | ICD-10-CM | POA: Diagnosis not present

## 2017-10-14 DIAGNOSIS — J324 Chronic pansinusitis: Secondary | ICD-10-CM | POA: Diagnosis not present

## 2017-10-20 DIAGNOSIS — R339 Retention of urine, unspecified: Secondary | ICD-10-CM | POA: Diagnosis not present

## 2017-10-20 DIAGNOSIS — Z960 Presence of urogenital implants: Secondary | ICD-10-CM | POA: Diagnosis not present

## 2017-10-20 DIAGNOSIS — R310 Gross hematuria: Secondary | ICD-10-CM | POA: Diagnosis not present

## 2017-10-21 DIAGNOSIS — R339 Retention of urine, unspecified: Secondary | ICD-10-CM | POA: Diagnosis not present

## 2017-10-29 DIAGNOSIS — N138 Other obstructive and reflux uropathy: Secondary | ICD-10-CM | POA: Diagnosis not present

## 2017-10-29 DIAGNOSIS — N401 Enlarged prostate with lower urinary tract symptoms: Secondary | ICD-10-CM | POA: Diagnosis not present

## 2017-10-29 DIAGNOSIS — R339 Retention of urine, unspecified: Secondary | ICD-10-CM | POA: Diagnosis not present

## 2017-10-29 DIAGNOSIS — R338 Other retention of urine: Secondary | ICD-10-CM | POA: Diagnosis not present

## 2017-10-31 ENCOUNTER — Encounter (HOSPITAL_COMMUNITY): Payer: Self-pay | Admitting: Emergency Medicine

## 2017-10-31 ENCOUNTER — Other Ambulatory Visit: Payer: Self-pay

## 2017-10-31 ENCOUNTER — Emergency Department (HOSPITAL_COMMUNITY)
Admission: EM | Admit: 2017-10-31 | Discharge: 2017-10-31 | Disposition: A | Discharge status: Home / Self Care | Payer: PPO | Attending: Emergency Medicine | Admitting: Emergency Medicine

## 2017-10-31 DIAGNOSIS — R338 Other retention of urine: Secondary | ICD-10-CM

## 2017-10-31 DIAGNOSIS — N475 Adhesions of prepuce and glans penis: Secondary | ICD-10-CM | POA: Diagnosis not present

## 2017-10-31 DIAGNOSIS — Z79899 Other long term (current) drug therapy: Secondary | ICD-10-CM | POA: Insufficient documentation

## 2017-10-31 DIAGNOSIS — R339 Retention of urine, unspecified: Secondary | ICD-10-CM | POA: Insufficient documentation

## 2017-10-31 DIAGNOSIS — F17210 Nicotine dependence, cigarettes, uncomplicated: Secondary | ICD-10-CM | POA: Diagnosis not present

## 2017-10-31 DIAGNOSIS — N481 Balanitis: Secondary | ICD-10-CM | POA: Insufficient documentation

## 2017-10-31 LAB — BASIC METABOLIC PANEL
Anion gap: 9 (ref 5–15)
BUN: 13 mg/dL (ref 8–23)
CALCIUM: 9 mg/dL (ref 8.9–10.3)
CO2: 27 mmol/L (ref 22–32)
CREATININE: 0.84 mg/dL (ref 0.61–1.24)
Chloride: 102 mmol/L (ref 98–111)
Glucose, Bld: 127 mg/dL — ABNORMAL HIGH (ref 70–99)
Potassium: 4.2 mmol/L (ref 3.5–5.1)
Sodium: 138 mmol/L (ref 135–145)

## 2017-10-31 LAB — CBC
HCT: 46 % (ref 39.0–52.0)
HEMOGLOBIN: 15.6 g/dL (ref 13.0–17.0)
MCH: 31 pg (ref 26.0–34.0)
MCHC: 33.9 g/dL (ref 30.0–36.0)
MCV: 91.5 fL (ref 78.0–100.0)
Platelets: 206 10*3/uL (ref 150–400)
RBC: 5.03 MIL/uL (ref 4.22–5.81)
RDW: 13.6 % (ref 11.5–15.5)
WBC: 6.6 10*3/uL (ref 4.0–10.5)

## 2017-10-31 LAB — URINALYSIS, ROUTINE W REFLEX MICROSCOPIC
Bacteria, UA: NONE SEEN
Bilirubin Urine: NEGATIVE
GLUCOSE, UA: NEGATIVE mg/dL
Ketones, ur: NEGATIVE mg/dL
LEUKOCYTES UA: NEGATIVE
Nitrite: NEGATIVE
PROTEIN: NEGATIVE mg/dL
RBC / HPF: >50 RBC/hpf — ABNORMAL HIGH (ref 0–5)
Specific Gravity, Urine: 1.015 (ref 1.005–1.030)
pH: 6 (ref 5.0–8.0)

## 2017-10-31 NOTE — Discharge Instructions (Addendum)
Contact a health care provider if: You develop a low-grade fever. You experience spasms or leakage of urine with the spasms. Get help right away if: You develop chills or fever. Your catheter stops draining urine. Your catheter falls out. You start to develop increased bleeding that does not respond to rest and increased fluid intake.

## 2017-10-31 NOTE — ED Notes (Signed)
BLADDER SCAN RESULTED >29mL's

## 2017-10-31 NOTE — ED Notes (Signed)
1L returned after foley catheter insertion
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
## 2017-10-31 NOTE — ED Triage Notes (Signed)
Pt arriving with urinary retention. Pt reports he had a catheter removed on Thursday and has been self cathing since. Pt states he has only seen thick blood while self cathing. Pt seen for same on 6/20. A&O x4. Ambulatory.

## 2017-11-10 DIAGNOSIS — Z0000 Encounter for general adult medical examination without abnormal findings: Secondary | ICD-10-CM | POA: Diagnosis not present

## 2017-11-10 DIAGNOSIS — R7303 Prediabetes: Secondary | ICD-10-CM | POA: Diagnosis not present

## 2017-11-10 DIAGNOSIS — I2510 Atherosclerotic heart disease of native coronary artery without angina pectoris: Secondary | ICD-10-CM | POA: Diagnosis not present

## 2017-11-10 DIAGNOSIS — E782 Mixed hyperlipidemia: Secondary | ICD-10-CM | POA: Diagnosis not present

## 2017-11-10 DIAGNOSIS — Z23 Encounter for immunization: Secondary | ICD-10-CM | POA: Diagnosis not present

## 2017-11-10 DIAGNOSIS — M545 Low back pain: Secondary | ICD-10-CM | POA: Diagnosis not present

## 2017-11-19 DIAGNOSIS — R319 Hematuria, unspecified: Secondary | ICD-10-CM | POA: Diagnosis not present

## 2017-11-19 DIAGNOSIS — J449 Chronic obstructive pulmonary disease, unspecified: Secondary | ICD-10-CM | POA: Diagnosis not present

## 2017-11-19 DIAGNOSIS — R972 Elevated prostate specific antigen [PSA]: Secondary | ICD-10-CM | POA: Diagnosis not present

## 2017-11-19 DIAGNOSIS — R7303 Prediabetes: Secondary | ICD-10-CM | POA: Diagnosis not present

## 2017-11-19 DIAGNOSIS — F17200 Nicotine dependence, unspecified, uncomplicated: Secondary | ICD-10-CM | POA: Diagnosis not present

## 2017-11-19 DIAGNOSIS — M150 Primary generalized (osteo)arthritis: Secondary | ICD-10-CM | POA: Diagnosis not present

## 2017-11-19 DIAGNOSIS — I2510 Atherosclerotic heart disease of native coronary artery without angina pectoris: Secondary | ICD-10-CM | POA: Diagnosis not present

## 2017-11-19 DIAGNOSIS — E782 Mixed hyperlipidemia: Secondary | ICD-10-CM | POA: Diagnosis not present

## 2017-11-19 DIAGNOSIS — D751 Secondary polycythemia: Secondary | ICD-10-CM | POA: Diagnosis not present

## 2017-11-19 DIAGNOSIS — Z0000 Encounter for general adult medical examination without abnormal findings: Secondary | ICD-10-CM | POA: Diagnosis not present

## 2017-12-09 DIAGNOSIS — J324 Chronic pansinusitis: Secondary | ICD-10-CM | POA: Diagnosis not present

## 2017-12-09 DIAGNOSIS — J342 Deviated nasal septum: Secondary | ICD-10-CM | POA: Diagnosis not present

## 2017-12-09 DIAGNOSIS — J343 Hypertrophy of nasal turbinates: Secondary | ICD-10-CM | POA: Diagnosis not present

## 2017-12-09 DIAGNOSIS — M868X8 Other osteomyelitis, other site: Secondary | ICD-10-CM | POA: Diagnosis not present

## 2017-12-09 DIAGNOSIS — Z48810 Encounter for surgical aftercare following surgery on the sense organs: Secondary | ICD-10-CM | POA: Diagnosis not present

## 2017-12-14 DIAGNOSIS — R338 Other retention of urine: Secondary | ICD-10-CM | POA: Diagnosis not present

## 2017-12-14 DIAGNOSIS — N401 Enlarged prostate with lower urinary tract symptoms: Secondary | ICD-10-CM | POA: Diagnosis not present

## 2017-12-14 DIAGNOSIS — N320 Bladder-neck obstruction: Secondary | ICD-10-CM | POA: Diagnosis not present

## 2017-12-14 DIAGNOSIS — N138 Other obstructive and reflux uropathy: Secondary | ICD-10-CM | POA: Diagnosis not present

## 2017-12-14 DIAGNOSIS — N3289 Other specified disorders of bladder: Secondary | ICD-10-CM | POA: Diagnosis not present

## 2017-12-17 DIAGNOSIS — Z466 Encounter for fitting and adjustment of urinary device: Secondary | ICD-10-CM | POA: Diagnosis not present

## 2017-12-17 DIAGNOSIS — R339 Retention of urine, unspecified: Secondary | ICD-10-CM | POA: Diagnosis not present

## 2017-12-28 DIAGNOSIS — R339 Retention of urine, unspecified: Secondary | ICD-10-CM | POA: Diagnosis not present

## 2018-01-07 DIAGNOSIS — N480 Leukoplakia of penis: Secondary | ICD-10-CM | POA: Diagnosis not present

## 2018-01-07 DIAGNOSIS — R339 Retention of urine, unspecified: Secondary | ICD-10-CM | POA: Diagnosis not present

## 2018-01-14 DIAGNOSIS — H9193 Unspecified hearing loss, bilateral: Secondary | ICD-10-CM | POA: Diagnosis not present

## 2018-01-14 DIAGNOSIS — H6120 Impacted cerumen, unspecified ear: Secondary | ICD-10-CM | POA: Diagnosis not present

## 2018-01-27 DIAGNOSIS — S61412A Laceration without foreign body of left hand, initial encounter: Secondary | ICD-10-CM | POA: Diagnosis not present

## 2018-01-27 DIAGNOSIS — H6120 Impacted cerumen, unspecified ear: Secondary | ICD-10-CM | POA: Diagnosis not present

## 2018-02-18 DIAGNOSIS — N3281 Overactive bladder: Secondary | ICD-10-CM | POA: Diagnosis not present

## 2018-02-18 DIAGNOSIS — N481 Balanitis: Secondary | ICD-10-CM | POA: Diagnosis not present

## 2018-02-18 DIAGNOSIS — N401 Enlarged prostate with lower urinary tract symptoms: Secondary | ICD-10-CM | POA: Diagnosis not present

## 2018-02-18 DIAGNOSIS — N138 Other obstructive and reflux uropathy: Secondary | ICD-10-CM | POA: Diagnosis not present

## 2018-03-01 ENCOUNTER — Other Ambulatory Visit: Payer: Self-pay

## 2018-03-01 ENCOUNTER — Telehealth: Payer: Self-pay | Admitting: Cardiology

## 2018-03-01 DIAGNOSIS — E785 Hyperlipidemia, unspecified: Secondary | ICD-10-CM

## 2018-03-01 DIAGNOSIS — I10 Essential (primary) hypertension: Secondary | ICD-10-CM

## 2018-03-01 DIAGNOSIS — I2510 Atherosclerotic heart disease of native coronary artery without angina pectoris: Secondary | ICD-10-CM

## 2018-03-01 MED ORDER — METOPROLOL TARTRATE 25 MG PO TABS
12.5000 mg | ORAL_TABLET | Freq: Two times a day (BID) | ORAL | 0 refills | Status: AC
Start: 2018-03-01 — End: ?

## 2018-03-01 MED ORDER — ATORVASTATIN CALCIUM 20 MG PO TABS: 20.0000 mg | ORAL_TABLET | Freq: Every day | ORAL | 0 refills | Status: AC

## 2018-03-01 NOTE — Telephone Encounter (Signed)
° ° ° °  1. Which medications need to be refilled? (please list name of each medication and dose if known) atorvastatin 20mg  tablet 1QD; metoprolol tartrate 25mg  1/2 BID  2. Which pharmacy/location (including street and city if local pharmacy) is medication to be sent to?[facility] [phone]  3. Do they need a 30 day or 90 day supply? [place]

## 2018-03-01 NOTE — Telephone Encounter (Signed)
Med refill for 30 days has been sent. Patient is agreeable to have labs done tomorrow.

## 2018-03-02 ENCOUNTER — Other Ambulatory Visit: Payer: PPO

## 2018-03-02 ENCOUNTER — Other Ambulatory Visit: Payer: Self-pay

## 2018-03-02 DIAGNOSIS — E785 Hyperlipidemia, unspecified: Secondary | ICD-10-CM

## 2018-03-02 DIAGNOSIS — I10 Essential (primary) hypertension: Secondary | ICD-10-CM

## 2018-03-02 DIAGNOSIS — I2510 Atherosclerotic heart disease of native coronary artery without angina pectoris: Secondary | ICD-10-CM

## 2018-03-13 DIAGNOSIS — M65332 Trigger finger, left middle finger: Secondary | ICD-10-CM | POA: Diagnosis not present

## 2018-04-01 ENCOUNTER — Ambulatory Visit: Payer: PPO | Admitting: Cardiology

## 2018-04-02 ENCOUNTER — Telehealth: Payer: Self-pay | Admitting: *Deleted

## 2018-04-02 NOTE — Telephone Encounter (Signed)
Patient informed by Theadora Rama, RMA that since he cancelled his appointment with Dr. Bettina Gavia yesterday that he needed to reschedule an appointment in order to refill medications as requested or he would have to get his PCP to refill. Patient states that he is going to be seen by a different cardiologist and does not wish to follow up with our office. No further questions.

## 2018-04-05 DIAGNOSIS — M79645 Pain in left finger(s): Secondary | ICD-10-CM | POA: Diagnosis not present

## 2018-04-05 DIAGNOSIS — M65332 Trigger finger, left middle finger: Secondary | ICD-10-CM | POA: Diagnosis not present

## 2018-04-27 DIAGNOSIS — H47292 Other optic atrophy, left eye: Secondary | ICD-10-CM | POA: Diagnosis not present

## 2018-04-27 DIAGNOSIS — H468 Other optic neuritis: Secondary | ICD-10-CM | POA: Diagnosis not present

## 2018-04-27 DIAGNOSIS — H35033 Hypertensive retinopathy, bilateral: Secondary | ICD-10-CM | POA: Diagnosis not present

## 2018-05-06 DIAGNOSIS — R0789 Other chest pain: Secondary | ICD-10-CM | POA: Diagnosis not present

## 2018-05-06 DIAGNOSIS — I2510 Atherosclerotic heart disease of native coronary artery without angina pectoris: Secondary | ICD-10-CM | POA: Diagnosis not present

## 2018-05-06 DIAGNOSIS — Z951 Presence of aortocoronary bypass graft: Secondary | ICD-10-CM | POA: Diagnosis not present

## 2018-05-06 DIAGNOSIS — I440 Atrioventricular block, first degree: Secondary | ICD-10-CM | POA: Diagnosis not present

## 2018-05-24 DIAGNOSIS — R1310 Dysphagia, unspecified: Secondary | ICD-10-CM | POA: Diagnosis not present

## 2018-05-27 DIAGNOSIS — N138 Other obstructive and reflux uropathy: Secondary | ICD-10-CM | POA: Diagnosis not present

## 2018-05-27 DIAGNOSIS — N401 Enlarged prostate with lower urinary tract symptoms: Secondary | ICD-10-CM | POA: Diagnosis not present

## 2018-05-31 IMAGING — CT CT ORBITS W/ CM
3 series · 13 of 47 positions shown, 15 images · IV contrast (iopamidol)
Comparison: CT of the orbits 04/17/2003

CLINICAL DATA: Infection of the left eye.

EXAM:
CT ORBITS WITH CONTRAST
TECHNIQUE: Multidetector CT images was performed according to the standard
protocol following intravenous contrast administration.
CONTRAST:  75mL KL558S-4FF IOPAMIDOL (KL558S-4FF) INJECTION 61%

[Series 4: orbits 2.0 h30s st · axial · 0.35mm/px · z∈[+1678,+1774]mm · 7 of 60 slices shown, 9 images]
[im 7/60  brain]
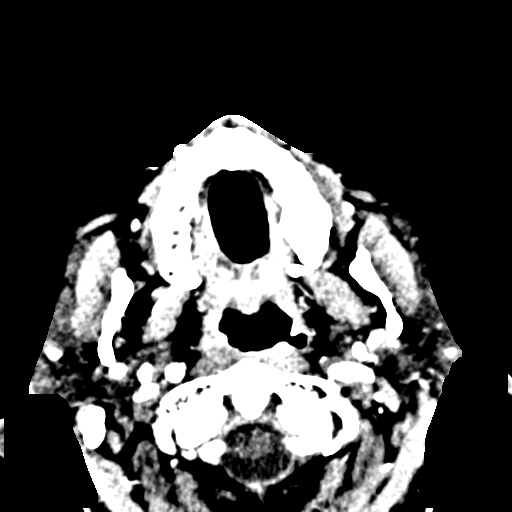
[im 7/60  bone]
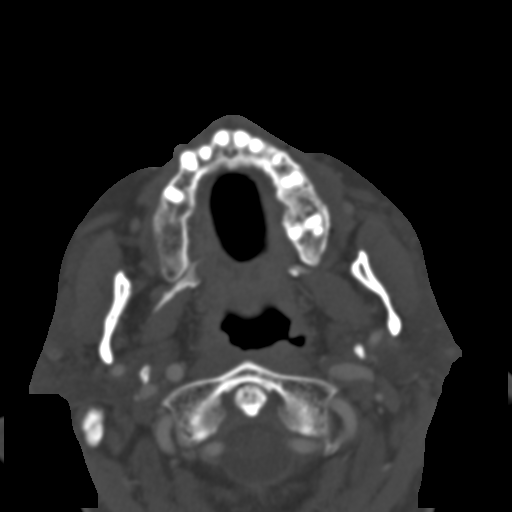
[im 15/60  bone]
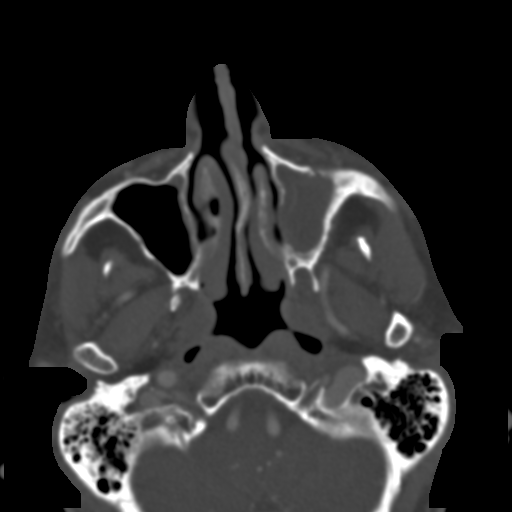
[im 23/60  bone]
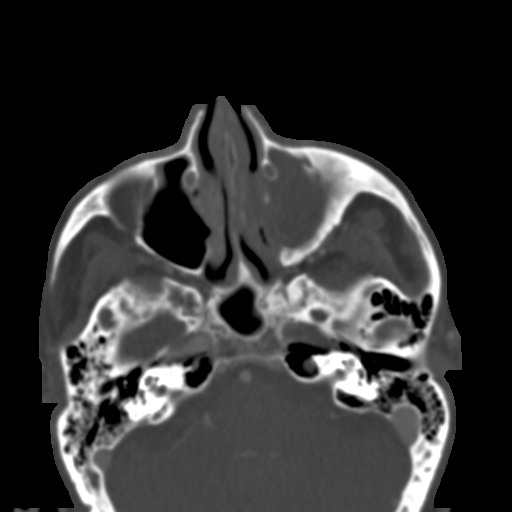
[im 31/60  bone]
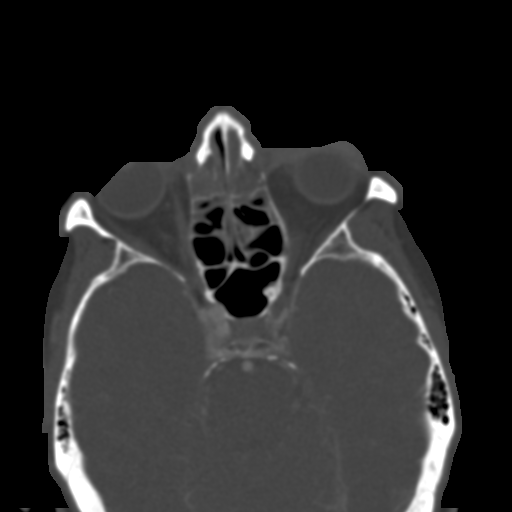
[im 39/60  brain]
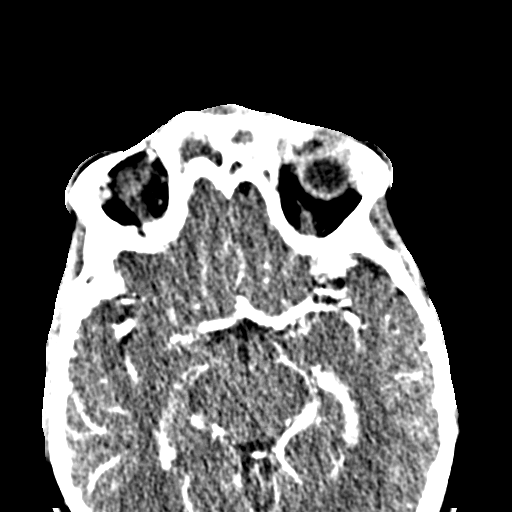
[im 39/60  bone]
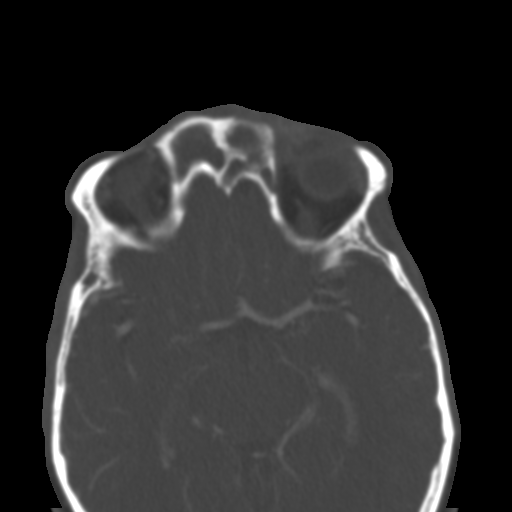
[im 47/60  bone]
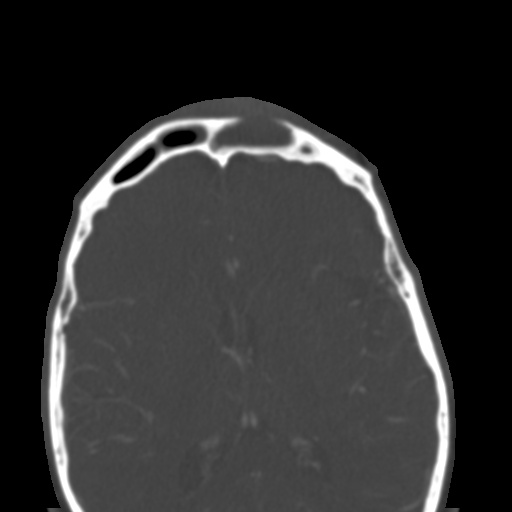
[im 55/60  bone]
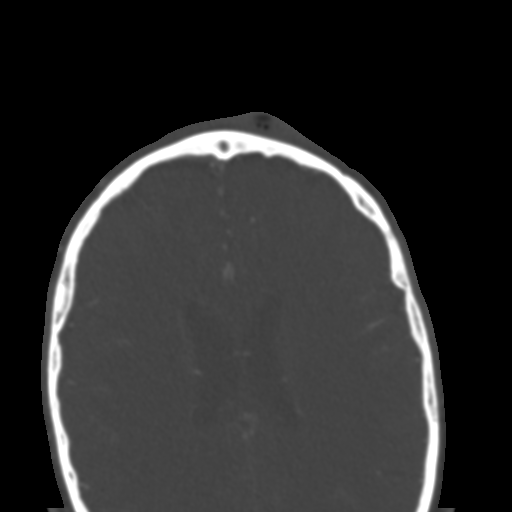

[Series 9: orbits 2.0 coronal · coronal · 0.27mm/px · 3 of 86 slices shown]
[im 29/86  bone]
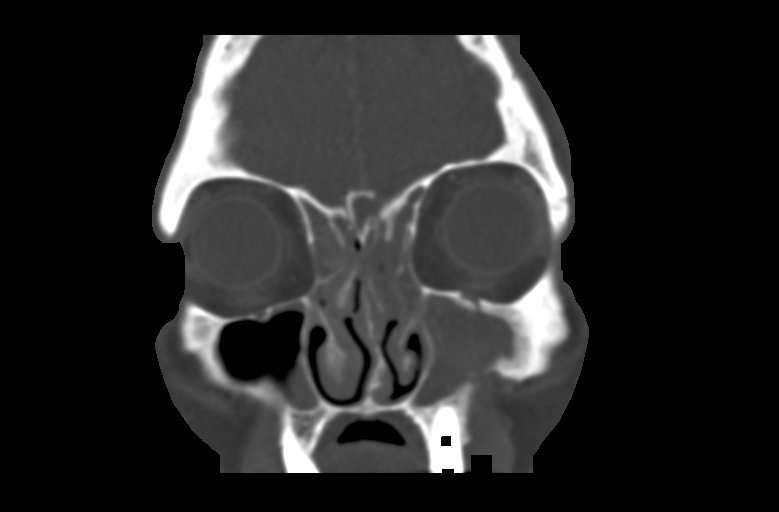
[im 38/86  bone]
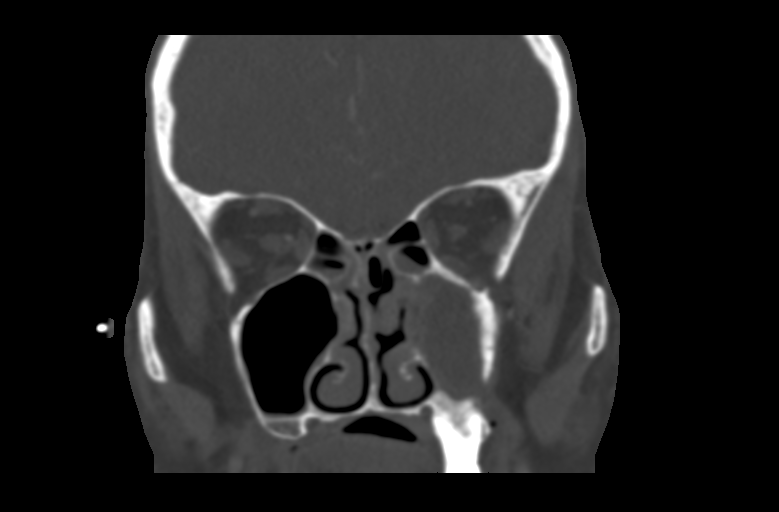
[im 48/86  bone]
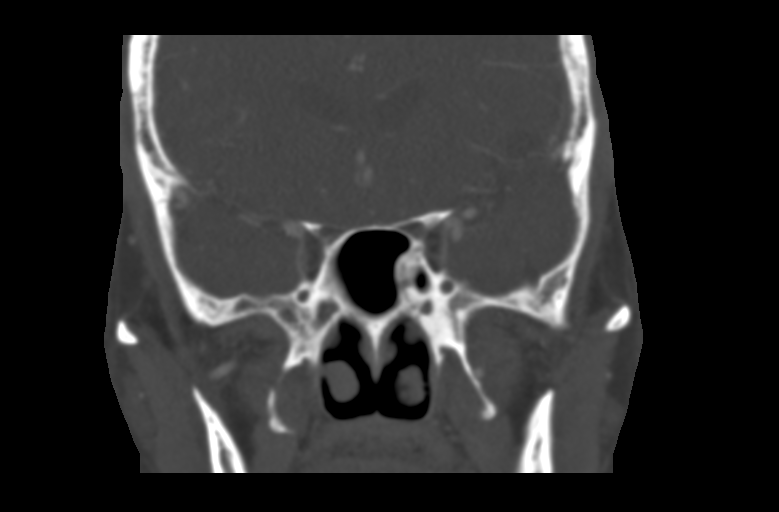

[Series 10: orbits 2.0 sagittal · sagittal · 0.28mm/px · 3 of 96 slices shown]
[im 32/96  bone]
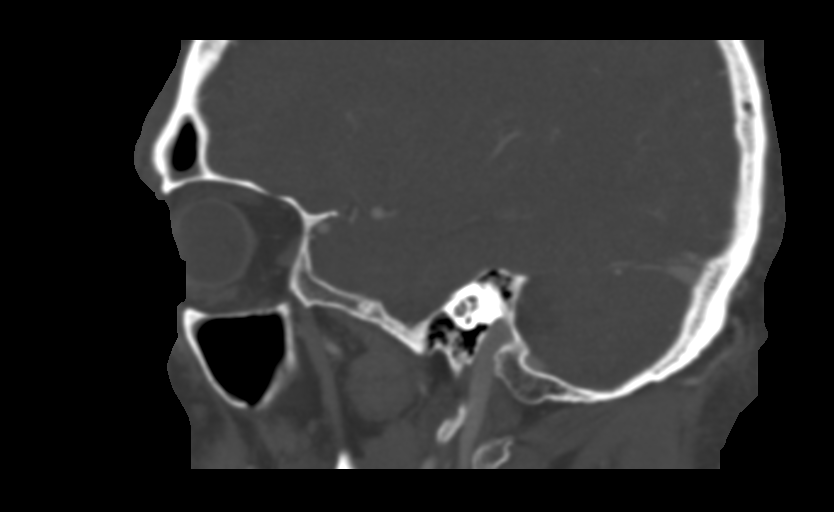
[im 48/96  bone]
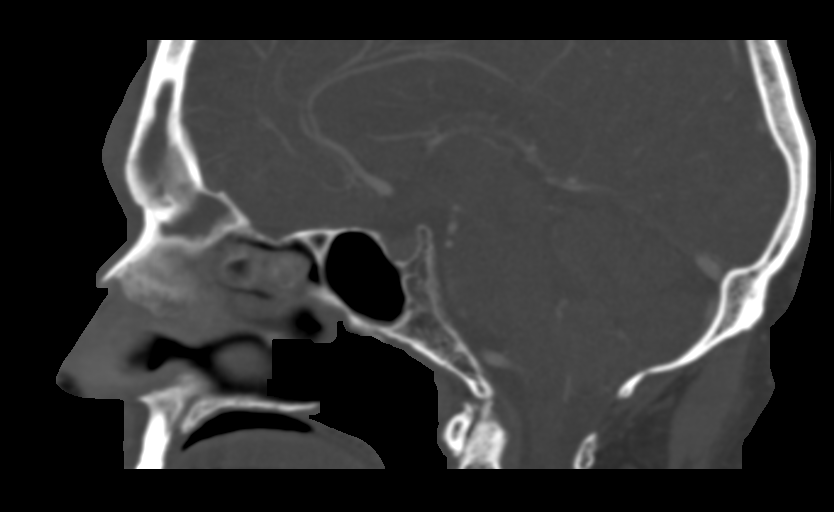
[im 64/96  bone]
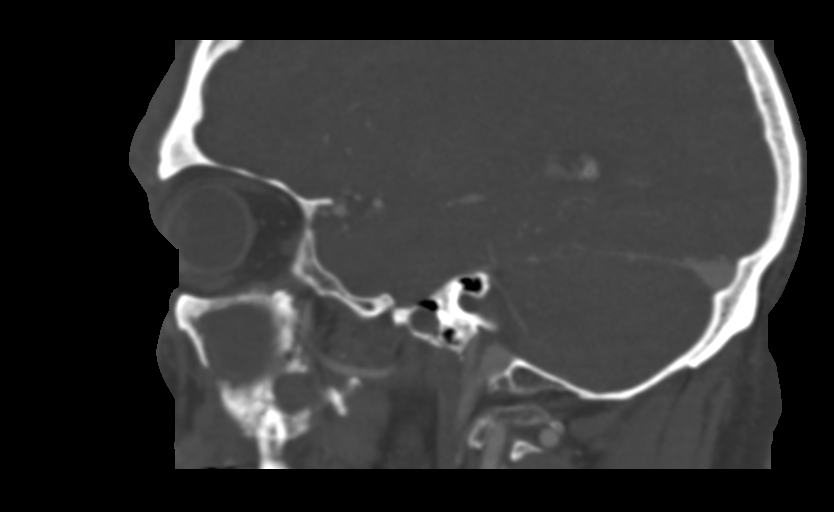

[13 of 47 positions shown; findings below may reference images not displayed]

FINDINGS: Orbits: Globes and postseptal orbits are within normal limits
bilaterally. There is extensive soft tissue swelling about the
scratched at there is extensive periorbital soft tissue swelling
about the left orbit.

Visualized sinuses: The left frontal sinus is opacified. There is
erosion of the anterior superior and inferolateral aspects of the
sinus extension in communication into the subgaleal soft tissues. A
gas collection is present within the left frontal scalp measuring 19
mm in transverse diameter. There is soft tissue opacification across
the frontoethmoidal recess the into the anterior right frontal
lobes.

The left frontal sinus is also completely opacified. Destructive
erosion is noted anterior and inferior and inferolateral with
minimal gas evident along the posterior left maxillary alveolar
ridge.

The right frontal sinus is near completely opacified. No osseous
destruction is evident. Anterior ethmoid air cells are opacified
bilaterally.

Significant left maxillary dental disease is present with a large
dental caries in the first left maxillary molar in the left canine
tooth. There is significant lucency surrounding the roots of the
left first canine molar.

Soft tissues: Extensive left periorbital soft tissue swelling
extends superiorly into the left frontal scalp with the
aforementioned gas collection.

Limited intracranial: There is no evidence for posterior osseous
destruction in the left frontal sinus. No extra-axial empyema is
evident. The visualized brain is within normal limits.
IMPRESSION: 1. Left frontal sinus opacification with anterior wall osseous
destruction and communication into the subgaleal soft tissues with
extensive associated soft tissue swelling extending to the level of
the orbit. Destruction
2. Additional osseous destruction inferiorly laterally in the left
frontal sinus just above the left orbit.
3. Chronic left maxillary sinus opacification with osseous
destruction inferiorly both anteriorly and laterally. A small gas
collection is noted along the posterolateral maxillary alveolar
ridge.
4. No evidence for intracranial extension of infection from the left
frontal sinus.
5. Diffuse anterior ethmoid and right frontal sinus disease without
other osseous destruction.
6. Diffuse left periorbital soft tissue swelling without postseptal
extension into the orbit.

These results were called by telephone at the time of interpretation
on 07/10/2017 at [DATE] to Dr. FELLO ALVARO , who verbally
acknowledged these results.

## 2018-06-03 DIAGNOSIS — J449 Chronic obstructive pulmonary disease, unspecified: Secondary | ICD-10-CM | POA: Diagnosis not present

## 2018-06-03 DIAGNOSIS — R7303 Prediabetes: Secondary | ICD-10-CM | POA: Diagnosis not present

## 2018-06-03 DIAGNOSIS — E782 Mixed hyperlipidemia: Secondary | ICD-10-CM | POA: Diagnosis not present

## 2018-06-03 DIAGNOSIS — I2510 Atherosclerotic heart disease of native coronary artery without angina pectoris: Secondary | ICD-10-CM | POA: Diagnosis not present

## 2018-06-07 DIAGNOSIS — K219 Gastro-esophageal reflux disease without esophagitis: Secondary | ICD-10-CM | POA: Diagnosis not present

## 2018-06-07 DIAGNOSIS — R1110 Vomiting, unspecified: Secondary | ICD-10-CM | POA: Diagnosis not present

## 2018-06-07 DIAGNOSIS — R1310 Dysphagia, unspecified: Secondary | ICD-10-CM | POA: Diagnosis not present

## 2018-06-10 DIAGNOSIS — R05 Cough: Secondary | ICD-10-CM | POA: Diagnosis not present

## 2018-06-10 DIAGNOSIS — J449 Chronic obstructive pulmonary disease, unspecified: Secondary | ICD-10-CM | POA: Diagnosis not present

## 2018-06-10 DIAGNOSIS — I2510 Atherosclerotic heart disease of native coronary artery without angina pectoris: Secondary | ICD-10-CM | POA: Diagnosis not present

## 2018-06-10 DIAGNOSIS — E1169 Type 2 diabetes mellitus with other specified complication: Secondary | ICD-10-CM | POA: Diagnosis not present

## 2018-06-10 DIAGNOSIS — E782 Mixed hyperlipidemia: Secondary | ICD-10-CM | POA: Diagnosis not present

## 2018-06-15 DIAGNOSIS — M868X8 Other osteomyelitis, other site: Secondary | ICD-10-CM | POA: Diagnosis not present

## 2018-06-15 DIAGNOSIS — J324 Chronic pansinusitis: Secondary | ICD-10-CM | POA: Diagnosis not present

## 2018-06-15 DIAGNOSIS — Z48810 Encounter for surgical aftercare following surgery on the sense organs: Secondary | ICD-10-CM | POA: Diagnosis not present

## 2018-06-15 DIAGNOSIS — J343 Hypertrophy of nasal turbinates: Secondary | ICD-10-CM | POA: Diagnosis not present

## 2018-06-15 DIAGNOSIS — Z7951 Long term (current) use of inhaled steroids: Secondary | ICD-10-CM | POA: Diagnosis not present

## 2018-06-15 DIAGNOSIS — Z9889 Other specified postprocedural states: Secondary | ICD-10-CM | POA: Diagnosis not present

## 2018-06-16 ENCOUNTER — Other Ambulatory Visit: Payer: Self-pay | Admitting: Gastroenterology

## 2018-06-24 ENCOUNTER — Ambulatory Visit (HOSPITAL_COMMUNITY): Admission: RE | Admit: 2018-06-24 | Payer: PPO | Source: Home / Self Care | Admitting: Gastroenterology

## 2018-06-24 ENCOUNTER — Encounter (HOSPITAL_COMMUNITY): Admission: RE | Payer: Self-pay | Source: Home / Self Care

## 2018-06-24 SURGERY — ESOPHAGOGASTRODUODENOSCOPY (EGD) WITH PROPOFOL
Anesthesia: Monitor Anesthesia Care
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
## 2018-07-05 ENCOUNTER — Ambulatory Visit: Payer: Self-pay | Admitting: Cardiology

## 2018-07-05 ENCOUNTER — Encounter: Payer: Self-pay | Admitting: Cardiology

## 2018-07-05 DIAGNOSIS — Z5329 Procedure and treatment not carried out because of patient's decision for other reasons: Secondary | ICD-10-CM

## 2018-07-14 ENCOUNTER — Telehealth: Payer: Self-pay

## 2018-07-14 ENCOUNTER — Ambulatory Visit: Payer: Self-pay | Admitting: Cardiology

## 2018-07-14 NOTE — Telephone Encounter (Signed)
Scheduled appt for 08/06/2018 @ 9:45am

## 2018-08-04 ENCOUNTER — Encounter: Payer: Self-pay | Admitting: Cardiology

## 2018-08-04 ENCOUNTER — Ambulatory Visit (INDEPENDENT_AMBULATORY_CARE_PROVIDER_SITE_OTHER): Payer: PPO | Admitting: Cardiology

## 2018-08-04 ENCOUNTER — Other Ambulatory Visit: Payer: Self-pay

## 2018-08-04 VITALS — BP 127/64 | HR 63 | Ht 68.5 in | Wt 177.0 lb

## 2018-08-04 DIAGNOSIS — E782 Mixed hyperlipidemia: Secondary | ICD-10-CM

## 2018-08-04 DIAGNOSIS — Z951 Presence of aortocoronary bypass graft: Secondary | ICD-10-CM | POA: Diagnosis not present

## 2018-08-04 DIAGNOSIS — I25118 Atherosclerotic heart disease of native coronary artery with other forms of angina pectoris: Secondary | ICD-10-CM | POA: Diagnosis not present

## 2018-08-04 DIAGNOSIS — R739 Hyperglycemia, unspecified: Secondary | ICD-10-CM

## 2018-08-04 DIAGNOSIS — I10 Essential (primary) hypertension: Secondary | ICD-10-CM | POA: Diagnosis not present

## 2018-08-04 HISTORY — DX: Hyperglycemia, unspecified: R73.9

## 2018-08-04 HISTORY — DX: Presence of aortocoronary bypass graft: Z95.1

## 2018-08-04 MED ORDER — NITROGLYCERIN 0.4 MG SL SUBL: 0.4000 mg | SUBLINGUAL_TABLET | SUBLINGUAL | 3 refills | Status: DC | PRN
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
## 2018-08-06 ENCOUNTER — Ambulatory Visit: Payer: Self-pay | Admitting: Cardiology

## 2018-08-23 DIAGNOSIS — M65332 Trigger finger, left middle finger: Secondary | ICD-10-CM | POA: Diagnosis not present

## 2018-08-23 DIAGNOSIS — M79645 Pain in left finger(s): Secondary | ICD-10-CM | POA: Diagnosis not present

## 2018-09-09 DIAGNOSIS — J449 Chronic obstructive pulmonary disease, unspecified: Secondary | ICD-10-CM | POA: Diagnosis not present

## 2018-09-09 DIAGNOSIS — E1169 Type 2 diabetes mellitus with other specified complication: Secondary | ICD-10-CM | POA: Diagnosis not present

## 2018-09-09 DIAGNOSIS — I2510 Atherosclerotic heart disease of native coronary artery without angina pectoris: Secondary | ICD-10-CM | POA: Diagnosis not present

## 2018-09-09 DIAGNOSIS — E782 Mixed hyperlipidemia: Secondary | ICD-10-CM | POA: Diagnosis not present

## 2018-09-15 DIAGNOSIS — M65332 Trigger finger, left middle finger: Secondary | ICD-10-CM | POA: Diagnosis not present

## 2018-09-16 DIAGNOSIS — E1169 Type 2 diabetes mellitus with other specified complication: Secondary | ICD-10-CM | POA: Diagnosis not present

## 2018-09-16 DIAGNOSIS — I2510 Atherosclerotic heart disease of native coronary artery without angina pectoris: Secondary | ICD-10-CM | POA: Diagnosis not present

## 2018-09-16 DIAGNOSIS — E782 Mixed hyperlipidemia: Secondary | ICD-10-CM | POA: Diagnosis not present

## 2018-09-16 DIAGNOSIS — Z7189 Other specified counseling: Secondary | ICD-10-CM | POA: Diagnosis not present

## 2018-09-27 DIAGNOSIS — M25642 Stiffness of left hand, not elsewhere classified: Secondary | ICD-10-CM | POA: Diagnosis not present

## 2018-10-12 DIAGNOSIS — M25642 Stiffness of left hand, not elsewhere classified: Secondary | ICD-10-CM | POA: Diagnosis not present

## 2018-12-14 DIAGNOSIS — M868X8 Other osteomyelitis, other site: Secondary | ICD-10-CM | POA: Diagnosis not present

## 2018-12-14 DIAGNOSIS — J341 Cyst and mucocele of nose and nasal sinus: Secondary | ICD-10-CM | POA: Diagnosis not present

## 2018-12-14 DIAGNOSIS — J343 Hypertrophy of nasal turbinates: Secondary | ICD-10-CM | POA: Diagnosis not present

## 2018-12-14 DIAGNOSIS — J324 Chronic pansinusitis: Secondary | ICD-10-CM | POA: Diagnosis not present

## 2018-12-14 DIAGNOSIS — Z87891 Personal history of nicotine dependence: Secondary | ICD-10-CM | POA: Diagnosis not present

## 2018-12-23 DIAGNOSIS — I2510 Atherosclerotic heart disease of native coronary artery without angina pectoris: Secondary | ICD-10-CM | POA: Diagnosis not present

## 2018-12-23 DIAGNOSIS — E1169 Type 2 diabetes mellitus with other specified complication: Secondary | ICD-10-CM | POA: Diagnosis not present

## 2018-12-23 DIAGNOSIS — E782 Mixed hyperlipidemia: Secondary | ICD-10-CM | POA: Diagnosis not present

## 2018-12-30 DIAGNOSIS — D751 Secondary polycythemia: Secondary | ICD-10-CM | POA: Diagnosis not present

## 2018-12-30 DIAGNOSIS — I2510 Atherosclerotic heart disease of native coronary artery without angina pectoris: Secondary | ICD-10-CM | POA: Diagnosis not present

## 2018-12-30 DIAGNOSIS — J449 Chronic obstructive pulmonary disease, unspecified: Secondary | ICD-10-CM | POA: Diagnosis not present

## 2018-12-30 DIAGNOSIS — E782 Mixed hyperlipidemia: Secondary | ICD-10-CM | POA: Diagnosis not present

## 2018-12-30 DIAGNOSIS — M150 Primary generalized (osteo)arthritis: Secondary | ICD-10-CM | POA: Diagnosis not present

## 2018-12-30 DIAGNOSIS — Z7189 Other specified counseling: Secondary | ICD-10-CM | POA: Diagnosis not present

## 2018-12-30 DIAGNOSIS — E1169 Type 2 diabetes mellitus with other specified complication: Secondary | ICD-10-CM | POA: Diagnosis not present

## 2018-12-30 DIAGNOSIS — F17200 Nicotine dependence, unspecified, uncomplicated: Secondary | ICD-10-CM | POA: Diagnosis not present

## 2018-12-30 DIAGNOSIS — Z23 Encounter for immunization: Secondary | ICD-10-CM | POA: Diagnosis not present

## 2018-12-30 DIAGNOSIS — Z0000 Encounter for general adult medical examination without abnormal findings: Secondary | ICD-10-CM | POA: Diagnosis not present

## 2019-02-10 ENCOUNTER — Ambulatory Visit (INDEPENDENT_AMBULATORY_CARE_PROVIDER_SITE_OTHER): Payer: PPO | Admitting: Cardiology

## 2019-02-10 ENCOUNTER — Other Ambulatory Visit: Payer: Self-pay

## 2019-02-10 ENCOUNTER — Encounter: Payer: Self-pay | Admitting: Cardiology

## 2019-02-10 VITALS — BP 120/77 | HR 67 | Temp 97.4°F | Ht 68.0 in | Wt 180.0 lb

## 2019-02-10 DIAGNOSIS — I10 Essential (primary) hypertension: Secondary | ICD-10-CM | POA: Diagnosis not present

## 2019-02-10 DIAGNOSIS — I25118 Atherosclerotic heart disease of native coronary artery with other forms of angina pectoris: Secondary | ICD-10-CM | POA: Diagnosis not present

## 2019-02-10 DIAGNOSIS — Z951 Presence of aortocoronary bypass graft: Secondary | ICD-10-CM | POA: Diagnosis not present
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
## 2019-02-18 DIAGNOSIS — M545 Low back pain: Secondary | ICD-10-CM | POA: Diagnosis not present

## 2019-02-18 DIAGNOSIS — M48062 Spinal stenosis, lumbar region with neurogenic claudication: Secondary | ICD-10-CM | POA: Diagnosis not present

## 2019-03-02 DIAGNOSIS — M5416 Radiculopathy, lumbar region: Secondary | ICD-10-CM | POA: Diagnosis not present

## 2019-03-09 DIAGNOSIS — M545 Low back pain: Secondary | ICD-10-CM | POA: Diagnosis not present

## 2019-04-26 DIAGNOSIS — E119 Type 2 diabetes mellitus without complications: Secondary | ICD-10-CM | POA: Diagnosis not present

## 2019-04-26 DIAGNOSIS — H468 Other optic neuritis: Secondary | ICD-10-CM | POA: Diagnosis not present

## 2019-04-26 DIAGNOSIS — H43822 Vitreomacular adhesion, left eye: Secondary | ICD-10-CM | POA: Diagnosis not present

## 2019-04-26 DIAGNOSIS — H25013 Cortical age-related cataract, bilateral: Secondary | ICD-10-CM | POA: Diagnosis not present

## 2019-04-26 DIAGNOSIS — H2513 Age-related nuclear cataract, bilateral: Secondary | ICD-10-CM | POA: Diagnosis not present

## 2019-07-07 DIAGNOSIS — E1169 Type 2 diabetes mellitus with other specified complication: Secondary | ICD-10-CM | POA: Diagnosis not present

## 2019-07-14 DIAGNOSIS — R7303 Prediabetes: Secondary | ICD-10-CM | POA: Diagnosis not present

## 2019-07-14 DIAGNOSIS — E782 Mixed hyperlipidemia: Secondary | ICD-10-CM | POA: Diagnosis not present

## 2019-07-14 DIAGNOSIS — E1169 Type 2 diabetes mellitus with other specified complication: Secondary | ICD-10-CM | POA: Diagnosis not present

## 2019-07-14 DIAGNOSIS — J449 Chronic obstructive pulmonary disease, unspecified: Secondary | ICD-10-CM | POA: Diagnosis not present

## 2019-07-14 DIAGNOSIS — I2510 Atherosclerotic heart disease of native coronary artery without angina pectoris: Secondary | ICD-10-CM | POA: Diagnosis not present

## 2019-08-05 DIAGNOSIS — J324 Chronic pansinusitis: Secondary | ICD-10-CM | POA: Diagnosis not present

## 2019-08-05 DIAGNOSIS — J341 Cyst and mucocele of nose and nasal sinus: Secondary | ICD-10-CM | POA: Diagnosis not present

## 2019-08-05 DIAGNOSIS — J343 Hypertrophy of nasal turbinates: Secondary | ICD-10-CM | POA: Diagnosis not present

## 2019-08-05 DIAGNOSIS — J328 Other chronic sinusitis: Secondary | ICD-10-CM | POA: Diagnosis not present

## 2019-08-05 DIAGNOSIS — M868X8 Other osteomyelitis, other site: Secondary | ICD-10-CM | POA: Diagnosis not present

## 2019-10-13 ENCOUNTER — Other Ambulatory Visit: Payer: Self-pay | Admitting: Cardiology

## 2019-10-13 DIAGNOSIS — I25118 Atherosclerotic heart disease of native coronary artery with other forms of angina pectoris: Secondary | ICD-10-CM

## 2019-11-16 ENCOUNTER — Ambulatory Visit: Payer: PPO | Admitting: Cardiology

## 2019-11-16 ENCOUNTER — Encounter: Payer: Self-pay | Admitting: Cardiology

## 2019-11-16 ENCOUNTER — Other Ambulatory Visit: Payer: Self-pay

## 2019-11-16 VITALS — BP 121/76 | HR 62 | Ht 68.5 in | Wt 181.0 lb

## 2019-11-16 DIAGNOSIS — I10 Essential (primary) hypertension: Secondary | ICD-10-CM

## 2019-11-16 DIAGNOSIS — Z951 Presence of aortocoronary bypass graft: Secondary | ICD-10-CM

## 2019-11-16 DIAGNOSIS — E782 Mixed hyperlipidemia: Secondary | ICD-10-CM | POA: Diagnosis not present

## 2019-11-16 DIAGNOSIS — I25118 Atherosclerotic heart disease of native coronary artery with other forms of angina pectoris: Secondary | ICD-10-CM | POA: Diagnosis not present

## 2019-11-16 DIAGNOSIS — I209 Angina pectoris, unspecified: Secondary | ICD-10-CM | POA: Diagnosis not present

## 2019-11-16 MED ORDER — NITROGLYCERIN 0.4 MG SL SUBL: 0.4000 mg | SUBLINGUAL_TABLET | SUBLINGUAL | 3 refills | Status: DC | PRN

## 2019-11-16 MED ORDER — AMLODIPINE BESYLATE 2.5 MG PO TABS: 2.5000 mg | ORAL_TABLET | Freq: Every day | ORAL | 2 refills | Status: DC

## 2019-11-16 NOTE — Patient Instructions (Signed)
Hold Metoprolol and Amlodipine one day before the stress test and on the day of the stress test
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
## 2019-11-17 ENCOUNTER — Ambulatory Visit: Payer: PPO

## 2019-11-17 DIAGNOSIS — I209 Angina pectoris, unspecified: Secondary | ICD-10-CM

## 2019-11-17 DIAGNOSIS — I25118 Atherosclerotic heart disease of native coronary artery with other forms of angina pectoris: Secondary | ICD-10-CM | POA: Diagnosis not present

## 2019-11-28 ENCOUNTER — Ambulatory Visit: Payer: PPO

## 2019-11-28 ENCOUNTER — Other Ambulatory Visit: Payer: Self-pay

## 2019-11-28 DIAGNOSIS — I209 Angina pectoris, unspecified: Secondary | ICD-10-CM

## 2019-11-28 DIAGNOSIS — I25118 Atherosclerotic heart disease of native coronary artery with other forms of angina pectoris: Secondary | ICD-10-CM | POA: Diagnosis not present

## 2019-11-30 NOTE — Progress Notes (Signed)
Overall stress test is low risk but does indeed show small defect suggestive of ischemia.  Patient has recently been having frequent episodes of angina, he is on medical therapy, will do follow-up and if he still persist to have angina will consider coronary angiography.

## 2019-12-15 ENCOUNTER — Other Ambulatory Visit: Payer: Self-pay

## 2019-12-15 ENCOUNTER — Encounter: Payer: Self-pay | Admitting: Cardiology

## 2019-12-15 ENCOUNTER — Ambulatory Visit: Payer: PPO | Admitting: Cardiology

## 2019-12-15 VITALS — BP 128/76 | HR 68 | Resp 15 | Ht 68.0 in | Wt 182.0 lb

## 2019-12-15 DIAGNOSIS — I209 Angina pectoris, unspecified: Secondary | ICD-10-CM

## 2019-12-15 DIAGNOSIS — I25118 Atherosclerotic heart disease of native coronary artery with other forms of angina pectoris: Secondary | ICD-10-CM | POA: Diagnosis not present

## 2019-12-15 DIAGNOSIS — Z951 Presence of aortocoronary bypass graft: Secondary | ICD-10-CM | POA: Diagnosis not present

## 2019-12-15 DIAGNOSIS — I10 Essential (primary) hypertension: Secondary | ICD-10-CM | POA: Diagnosis not present

## 2019-12-15 MED ORDER — AMLODIPINE BESYLATE 2.5 MG PO TABS: 2.5000 mg | ORAL_TABLET | Freq: Every day | ORAL | 3 refills | Status: DC
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
## 2019-12-16 ENCOUNTER — Ambulatory Visit: Payer: PPO | Admitting: Cardiology

## 2020-01-12 DIAGNOSIS — I2510 Atherosclerotic heart disease of native coronary artery without angina pectoris: Secondary | ICD-10-CM | POA: Diagnosis not present

## 2020-01-12 DIAGNOSIS — E1169 Type 2 diabetes mellitus with other specified complication: Secondary | ICD-10-CM | POA: Diagnosis not present

## 2020-01-12 DIAGNOSIS — E782 Mixed hyperlipidemia: Secondary | ICD-10-CM | POA: Diagnosis not present

## 2020-01-19 DIAGNOSIS — J449 Chronic obstructive pulmonary disease, unspecified: Secondary | ICD-10-CM | POA: Diagnosis not present

## 2020-01-19 DIAGNOSIS — R7303 Prediabetes: Secondary | ICD-10-CM | POA: Diagnosis not present

## 2020-01-19 DIAGNOSIS — F17200 Nicotine dependence, unspecified, uncomplicated: Secondary | ICD-10-CM | POA: Diagnosis not present

## 2020-01-19 DIAGNOSIS — E1169 Type 2 diabetes mellitus with other specified complication: Secondary | ICD-10-CM | POA: Diagnosis not present

## 2020-01-19 DIAGNOSIS — I2510 Atherosclerotic heart disease of native coronary artery without angina pectoris: Secondary | ICD-10-CM | POA: Diagnosis not present

## 2020-01-19 DIAGNOSIS — E782 Mixed hyperlipidemia: Secondary | ICD-10-CM | POA: Diagnosis not present

## 2020-01-19 DIAGNOSIS — Z0000 Encounter for general adult medical examination without abnormal findings: Secondary | ICD-10-CM | POA: Diagnosis not present

## 2020-01-19 DIAGNOSIS — M150 Primary generalized (osteo)arthritis: Secondary | ICD-10-CM | POA: Diagnosis not present

## 2020-01-19 DIAGNOSIS — D751 Secondary polycythemia: Secondary | ICD-10-CM | POA: Diagnosis not present

## 2020-02-10 ENCOUNTER — Ambulatory Visit: Payer: PPO | Admitting: Cardiology

## 2020-02-15 ENCOUNTER — Inpatient Hospital Stay (HOSPITAL_COMMUNITY)
Admission: EM | Admit: 2020-02-15 | Discharge: 2020-02-18 | DRG: 603 | Disposition: A | Discharge status: Home / Self Care | Payer: PPO | Attending: Internal Medicine | Admitting: Internal Medicine

## 2020-02-15 ENCOUNTER — Encounter (HOSPITAL_COMMUNITY): Payer: Self-pay

## 2020-02-15 ENCOUNTER — Other Ambulatory Visit: Payer: Self-pay

## 2020-02-15 DIAGNOSIS — J449 Chronic obstructive pulmonary disease, unspecified: Secondary | ICD-10-CM | POA: Diagnosis not present

## 2020-02-15 DIAGNOSIS — L538 Other specified erythematous conditions: Secondary | ICD-10-CM | POA: Diagnosis not present

## 2020-02-15 DIAGNOSIS — I25118 Atherosclerotic heart disease of native coronary artery with other forms of angina pectoris: Secondary | ICD-10-CM

## 2020-02-15 DIAGNOSIS — R739 Hyperglycemia, unspecified: Secondary | ICD-10-CM | POA: Diagnosis present

## 2020-02-15 DIAGNOSIS — I10 Essential (primary) hypertension: Secondary | ICD-10-CM | POA: Diagnosis present

## 2020-02-15 DIAGNOSIS — I2510 Atherosclerotic heart disease of native coronary artery without angina pectoris: Secondary | ICD-10-CM | POA: Diagnosis present

## 2020-02-15 DIAGNOSIS — I252 Old myocardial infarction: Secondary | ICD-10-CM | POA: Diagnosis not present

## 2020-02-15 DIAGNOSIS — Z951 Presence of aortocoronary bypass graft: Secondary | ICD-10-CM

## 2020-02-15 DIAGNOSIS — L0390 Cellulitis, unspecified: Secondary | ICD-10-CM | POA: Diagnosis not present

## 2020-02-15 DIAGNOSIS — Z8249 Family history of ischemic heart disease and other diseases of the circulatory system: Secondary | ICD-10-CM | POA: Diagnosis not present

## 2020-02-15 DIAGNOSIS — E785 Hyperlipidemia, unspecified: Secondary | ICD-10-CM | POA: Diagnosis not present

## 2020-02-15 DIAGNOSIS — M79609 Pain in unspecified limb: Secondary | ICD-10-CM | POA: Diagnosis not present

## 2020-02-15 DIAGNOSIS — Z7982 Long term (current) use of aspirin: Secondary | ICD-10-CM | POA: Diagnosis not present

## 2020-02-15 DIAGNOSIS — M7989 Other specified soft tissue disorders: Secondary | ICD-10-CM | POA: Diagnosis not present

## 2020-02-15 DIAGNOSIS — L03115 Cellulitis of right lower limb: Secondary | ICD-10-CM | POA: Diagnosis not present

## 2020-02-15 DIAGNOSIS — Z87891 Personal history of nicotine dependence: Secondary | ICD-10-CM

## 2020-02-15 DIAGNOSIS — Z79899 Other long term (current) drug therapy: Secondary | ICD-10-CM | POA: Diagnosis not present

## 2020-02-15 DIAGNOSIS — Z20822 Contact with and (suspected) exposure to covid-19: Secondary | ICD-10-CM | POA: Diagnosis present

## 2020-02-15 DIAGNOSIS — R52 Pain, unspecified: Secondary | ICD-10-CM

## 2020-02-15 LAB — CBC WITH DIFFERENTIAL/PLATELET
Abs Immature Granulocytes: 0.05 10*3/uL (ref 0.00–0.07)
Basophils Absolute: 0.1 10*3/uL (ref 0.0–0.1)
Basophils Relative: 1 %
Eosinophils Absolute: 0 10*3/uL (ref 0.0–0.5)
Eosinophils Relative: 0 %
HCT: 48 % (ref 39.0–52.0)
Hemoglobin: 16.4 g/dL (ref 13.0–17.0)
Immature Granulocytes: 0 %
Lymphocytes Relative: 7 %
Lymphs Abs: 1 10*3/uL (ref 0.7–4.0)
MCH: 31.1 pg (ref 26.0–34.0)
MCHC: 34.2 g/dL (ref 30.0–36.0)
MCV: 90.9 fL (ref 80.0–100.0)
Monocytes Absolute: 0.6 10*3/uL (ref 0.1–1.0)
Monocytes Relative: 4 %
Neutro Abs: 11.6 10*3/uL — ABNORMAL HIGH (ref 1.7–7.7)
Neutrophils Relative %: 88 %
Platelets: 181 10*3/uL (ref 150–400)
RBC: 5.28 MIL/uL (ref 4.22–5.81)
RDW: 13 % (ref 11.5–15.5)
WBC: 13.3 10*3/uL — ABNORMAL HIGH (ref 4.0–10.5)
nRBC: 0 % (ref 0.0–0.2)

## 2020-02-15 LAB — COMPREHENSIVE METABOLIC PANEL
ALT: 26 U/L (ref 0–44)
AST: 20 U/L (ref 15–41)
Albumin: 4.4 g/dL (ref 3.5–5.0)
Alkaline Phosphatase: 73 U/L (ref 38–126)
Anion gap: 13 (ref 5–15)
BUN: 16 mg/dL (ref 8–23)
CO2: 25 mmol/L (ref 22–32)
Calcium: 9.4 mg/dL (ref 8.9–10.3)
Chloride: 98 mmol/L (ref 98–111)
Creatinine, Ser: 1 mg/dL (ref 0.61–1.24)
GFR, Estimated: >60 mL/min (ref >60)
Glucose, Bld: 140 mg/dL — ABNORMAL HIGH (ref 70–99)
Potassium: 3.7 mmol/L (ref 3.5–5.1)
Sodium: 136 mmol/L (ref 135–145)
Total Bilirubin: 1.6 mg/dL — ABNORMAL HIGH (ref 0.3–1.2)
Total Protein: 7.9 g/dL (ref 6.5–8.1)

## 2020-02-15 LAB — RESPIRATORY PANEL BY RT PCR (FLU A&B, COVID)
Influenza A by PCR: NEGATIVE
Influenza B by PCR: NEGATIVE
SARS Coronavirus 2 by RT PCR: NEGATIVE

## 2020-02-15 LAB — LACTIC ACID, PLASMA: Lactic Acid, Venous: 1.4 mmol/L (ref 0.5–1.9)

## 2020-02-15 MED ORDER — METOPROLOL TARTRATE 25 MG PO TABS
12.5000 mg | ORAL_TABLET | Freq: Two times a day (BID) | ORAL | Status: DC
  Administered 2020-02-15 – 2020-02-18 (×6): 12.5 mg via ORAL
  Filled 2020-02-15 (×6): qty 1

## 2020-02-15 MED ORDER — NITROGLYCERIN 0.4 MG SL SUBL: 0.4000 mg | SUBLINGUAL_TABLET | SUBLINGUAL | Status: DC | PRN

## 2020-02-15 MED ORDER — AMLODIPINE BESYLATE 5 MG PO TABS
2.5000 mg | ORAL_TABLET | Freq: Every day | ORAL | Status: DC
  Administered 2020-02-15 – 2020-02-18 (×4): 2.5 mg via ORAL
  Filled 2020-02-15 (×4): qty 1

## 2020-02-15 MED ORDER — CEFAZOLIN SODIUM-DEXTROSE 1-4 GM/50ML-% IV SOLN
1.0000 g | Freq: Once | INTRAVENOUS | Status: AC
  Administered 2020-02-15: 1 g via INTRAVENOUS
  Filled 2020-02-15: qty 50

## 2020-02-15 MED ORDER — SODIUM CHLORIDE 0.9 % IV BOLUS
1000.0000 mL | Freq: Once | INTRAVENOUS | Status: AC
  Administered 2020-02-15: 1000 mL via INTRAVENOUS

## 2020-02-15 MED ORDER — ACETAMINOPHEN 325 MG PO TABS
650.0000 mg | ORAL_TABLET | Freq: Four times a day (QID) | ORAL | Status: DC | PRN
  Administered 2020-02-15 – 2020-02-17 (×4): 650 mg via ORAL
  Filled 2020-02-15 (×4): qty 2

## 2020-02-15 MED ORDER — ENOXAPARIN SODIUM 40 MG/0.4ML ~~LOC~~ SOLN
40.0000 mg | SUBCUTANEOUS | Status: DC
  Administered 2020-02-15 – 2020-02-17 (×3): 40 mg via SUBCUTANEOUS
  Filled 2020-02-15 (×3): qty 0.4

## 2020-02-15 MED ORDER — SODIUM CHLORIDE 0.9 % IV SOLN: INTRAVENOUS | Status: DC | PRN

## 2020-02-15 MED ORDER — ONDANSETRON HCL 4 MG/2ML IJ SOLN: 4.0000 mg | Freq: Four times a day (QID) | INTRAMUSCULAR | Status: DC | PRN

## 2020-02-15 MED ORDER — ASPIRIN 81 MG PO CHEW
81.0000 mg | CHEWABLE_TABLET | Freq: Every day | ORAL | Status: DC
  Administered 2020-02-15 – 2020-02-18 (×4): 81 mg via ORAL
  Filled 2020-02-15 (×4): qty 1

## 2020-02-15 MED ORDER — SODIUM CHLORIDE 0.9 % IV SOLN
1.0000 g | INTRAVENOUS | Status: DC
  Administered 2020-02-15: 1 g via INTRAVENOUS
  Filled 2020-02-15: qty 1
  Filled 2020-02-15: qty 10

## 2020-02-15 MED ORDER — ATORVASTATIN CALCIUM 10 MG PO TABS
20.0000 mg | ORAL_TABLET | Freq: Every day | ORAL | Status: DC
  Administered 2020-02-15 – 2020-02-17 (×3): 20 mg via ORAL
  Filled 2020-02-15 (×3): qty 2

## 2020-02-15 MED ORDER — ONDANSETRON HCL 4 MG PO TABS: 4.0000 mg | ORAL_TABLET | Freq: Four times a day (QID) | ORAL | Status: DC | PRN
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
## 2020-02-15 NOTE — ED Notes (Signed)
Right leg swollen and red, two red streaks starting at calf area and going up to mid thigh. Rayna Sexton, PA aware. Pt gives pain 0/10, pt states does not hurt him when he is not moving around.

## 2020-02-15 NOTE — ED Notes (Signed)
This RN attempted to call report, no answer. Will try again.
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
## 2020-02-15 NOTE — Progress Notes (Signed)
Notified Dr. Neysa Bonito by page that patient is having minimal pain, but does not have any prn pain medication orders. Will continue to monitor

## 2020-02-15 NOTE — ED Triage Notes (Addendum)
Pt arrived via walk in, c/o right leg pain, leg visibly swollen and red, red line following up leg. States cut leg approx 1 week ago, and has worsening since. States his dogs have been licking at site.

## 2020-02-15 NOTE — Plan of Care (Signed)

## 2020-02-16 ENCOUNTER — Inpatient Hospital Stay (HOSPITAL_COMMUNITY): Payer: PPO

## 2020-02-16 DIAGNOSIS — L538 Other specified erythematous conditions: Secondary | ICD-10-CM

## 2020-02-16 DIAGNOSIS — M79609 Pain in unspecified limb: Secondary | ICD-10-CM

## 2020-02-16 LAB — BASIC METABOLIC PANEL
Anion gap: 9 (ref 5–15)
BUN: 13 mg/dL (ref 8–23)
CO2: 24 mmol/L (ref 22–32)
Calcium: 8.3 mg/dL — ABNORMAL LOW (ref 8.9–10.3)
Chloride: 101 mmol/L (ref 98–111)
Creatinine, Ser: 0.95 mg/dL (ref 0.61–1.24)
GFR, Estimated: >60 mL/min (ref >60)
Glucose, Bld: 136 mg/dL — ABNORMAL HIGH (ref 70–99)
Potassium: 3.8 mmol/L (ref 3.5–5.1)
Sodium: 134 mmol/L — ABNORMAL LOW (ref 135–145)

## 2020-02-16 LAB — CBC
HCT: 43.5 % (ref 39.0–52.0)
Hemoglobin: 14.6 g/dL (ref 13.0–17.0)
MCH: 30.8 pg (ref 26.0–34.0)
MCHC: 33.6 g/dL (ref 30.0–36.0)
MCV: 91.8 fL (ref 80.0–100.0)
Platelets: 152 10*3/uL (ref 150–400)
RBC: 4.74 MIL/uL (ref 4.22–5.81)
RDW: 13.1 % (ref 11.5–15.5)
WBC: 10.2 10*3/uL (ref 4.0–10.5)
nRBC: 0 % (ref 0.0–0.2)

## 2020-02-16 MED ORDER — FENTANYL CITRATE (PF) 100 MCG/2ML IJ SOLN
25.0000 ug | Freq: Once | INTRAMUSCULAR | Status: AC
  Administered 2020-02-16: 25 ug via INTRAVENOUS
  Filled 2020-02-16 (×2): qty 2

## 2020-02-16 MED ORDER — SODIUM CHLORIDE 0.9 % IV SOLN
3.0000 g | Freq: Four times a day (QID) | INTRAVENOUS | Status: DC
  Administered 2020-02-16 – 2020-02-18 (×8): 3 g via INTRAVENOUS
  Filled 2020-02-16 (×2): qty 3
  Filled 2020-02-16: qty 8
  Filled 2020-02-16 (×2): qty 3
  Filled 2020-02-16 (×2): qty 8
  Filled 2020-02-16 (×4): qty 3

## 2020-02-16 MED ORDER — TRAMADOL HCL 50 MG PO TABS
50.0000 mg | ORAL_TABLET | Freq: Four times a day (QID) | ORAL | Status: DC | PRN
  Administered 2020-02-16 – 2020-02-17 (×2): 50 mg via ORAL
  Filled 2020-02-16 (×2): qty 1

## 2020-02-16 NOTE — Progress Notes (Signed)
Right lower extremity venous duplex has been completed. Preliminary results can be found in CV Proc through chart review.   02/16/20 11:26 AM Carlos Levering RVT

## 2020-02-16 NOTE — Progress Notes (Signed)
PROGRESS NOTE    Sean Bridges  JTT:[id number] DOB: [birth year] DOA: 02/15/2020 PCP: [name], MD   Chief Complaint  Patient presents with  . Leg Swelling    Brief Narrative: (Start on day 1 of progress note - keep it brief and live) HPI Per Dr. Neysa Bonito This is a 74 year old male with past medical history of COPD, CAD s/p CABG who presented to the ED with complaints of right leg pain, swelling and erythema which she noticed today.  States that he injured his right shin about 4 months ago which has not ever healed completely and then about a week ago believes he injured it again on something but is not sure what.  Reports that his 2 small dogs have been licking at it occasionally over the past week, though he has tried to stop them.  He reports that yesterday he had a fever up to 102F as well as an episode of vomiting and lethargy yesterday but did not notice any rash until this morning which prompted him to come to the ED.  No chest pain, diarrhea, constipation or any other complaints.  Never had this before.  ED Course: Afebrile and hemodynamically stable on room air though the patient reported that he took antipyretics prior to coming to the ED.  Notable labs: WBC 13.3.  He was given Ancef and 1 L NS bolus in the ED.   Assessment & Plan:   Principal Problem:   Cellulitis Active Problems:   CAD (coronary artery disease)   HTN (hypertension)  1 nonpurulent right lower extremity cellulitis Questionable etiology.  Patient currently afebrile however noted to have had a fever of 102 prior to admission and also noted to have a leukocytosis.  Patient with complaints of pain in his right lower extremity with difficulty bearing weight last night which was new to patient as he stated was able to ambulate prior to admission.  Per admission H&P patient has 2 small dogs that had been licking it occasionally over the past week.  Will check plain films of the right tib-fib to rule out  any occult fracture.  Check lower extremity Dopplers to rule out DVT.  Change IV Rocephin to IV Unasyn for better coverage as patient noted to have had dogs licking the wound.  Ultram as needed pain.  Supportive care.  Follow.  2.  Hypertension Continue home regimen of amlodipine and Lopressor.  3.  Coronary artery disease status post CABG Stable.  Continue cardiac regimen of aspirin, statin, beta-blocker.   DVT prophylaxis: Lovenox Code Status: Full Family Communication: Updated patient and girlfriend at bedside. Disposition:   Status is: Inpatient    Dispo: The patient is from: Home              Anticipated d/c is to: Home              Anticipated d/c date is: 2 to 3 days.              Patient currently on IV antibiotics.  Not stable for discharge.       Consultants:   None  Procedures:   Plain films of the tib-fib pending 02/16/2020  Lower extremity Dopplers pending 02/16/2020  Antimicrobials:   IV Rocephin 02/15/2020>>>> 02/16/2020  IV Unasyn 02/16/2020   Subjective: Patient sitting up in bed.  Complain of pain in the right lower extremity which had worsened overnight to the point he was unable to bear weight.  Patient states he is fairly active  prior to admission.  Denies any chest pain.  No shortness of breath.  Objective: Vitals:   02/16/20 0556 02/16/20 0917 02/16/20 1039 02/16/20 1402  BP: 106/63 109/62 113/64 116/75  Pulse: 62 73 68 72  Resp: 20  18 20   Temp: 98.5 F (36.9 C)  97.9 F (36.6 C) 98.5 F (36.9 C)  TempSrc: Oral     SpO2: 93%  95%     Intake/Output Summary (Last 24 hours) at 02/16/2020 1659 Last data filed at 02/16/2020 1642 Gross per 24 hour  Intake 610.33 ml  Output --  Net 610.33 ml   There were no vitals filed for this visit.  Examination:  General exam: Appears calm and comfortable  Respiratory system: Clear to auscultation. Respiratory effort normal. Cardiovascular system: S1 & S2 heard, RRR. No JVD, murmurs, rubs,  gallops or clicks. No pedal edema. Gastrointestinal system: Abdomen is nondistended, soft and nontender. No organomegaly or masses felt. Normal bowel sounds heard. Central nervous system: Alert and oriented. No focal neurological deficits. Extremities: Symmetric 5 x 5 power. Skin: Right lower extremity with erythema, warmth, tenderness to palpation.  Streaking noted on the right thigh with some improvement.   Psychiatry: Judgement and insight appear normal. Mood & affect appropriate.           Data Reviewed: I have personally reviewed following labs and imaging studies  CBC: Recent Labs  Lab 02/15/20 1345 02/16/20 0258  WBC 13.3* 10.2  NEUTROABS 11.6*  --   HGB 16.4 14.6  HCT 48.0 43.5  MCV 90.9 91.8  PLT 181 616    Basic Metabolic Panel: Recent Labs  Lab 02/15/20 1345 02/16/20 0258  NA 136 134*  K 3.7 3.8  CL 98 101  CO2 25 24  GLUCOSE 140* 136*  BUN 16 13  CREATININE 1.00 0.95  CALCIUM 9.4 8.3*    GFR: CrCl cannot be calculated (Unknown ideal weight.).  Liver Function Tests: Recent Labs  Lab 02/15/20 1345  AST 20  ALT 26  ALKPHOS 73  BILITOT 1.6*  PROT 7.9  ALBUMIN 4.4    CBG: No results for input(s): GLUCAP in the last 168 hours.   Recent Results (from the past 240 hour(s))  Culture, blood (routine x 2)     Status: None (Preliminary result)   Collection Time: 02/15/20  1:45 PM   Specimen: BLOOD LEFT FOREARM  Result Value Ref Range Status   Specimen Description   Final    BLOOD LEFT FOREARM Performed at [facility] [address] [place], [place] [address]    Special Requests   Final    BOTTLES DRAWN AEROBIC AND ANAEROBIC Blood Culture adequate volume Performed at [facility] [address], [place], [place] [address]    Culture   Final    NO GROWTH < 24 HOURS Performed at [facility] [address], [place], [place] [address]    Report Status PENDING  Incomplete  Culture, blood  (routine x 2)     Status: None (Preliminary result)   Collection Time: 02/15/20  1:55 PM   Specimen: BLOOD RIGHT FOREARM  Result Value Ref Range Status   Specimen Description   Final    BLOOD RIGHT FOREARM Performed at [facility] [address], [place], [place] [address]    Special Requests   Final    BOTTLES DRAWN AEROBIC AND ANAEROBIC Blood Culture adequate volume Performed at [facility] [address] [place],  [place] [address]    Culture   Final    NO GROWTH < 24 HOURS Performed at [facility], [place] [address], [place], [place] [address]    Report Status PENDING  Incomplete  Respiratory Panel by RT PCR (Flu A&B, Covid) - Nasopharyngeal Swab     Status: None   Collection Time: 02/15/20  3:00 PM   Specimen: Nasopharyngeal Swab  Result Value Ref Range Status   SARS Coronavirus 2 by RT PCR NEGATIVE NEGATIVE Final    Comment: (NOTE) SARS-CoV-2 target nucleic acids are NOT DETECTED.  The SARS-CoV-2 RNA is generally detectable in upper respiratoy specimens during the acute phase of infection. The lowest concentration of SARS-CoV-2 viral copies this assay can detect is 131 copies/mL. A negative result does not preclude SARS-Cov-2 infection and should not be used as the sole basis for treatment or other patient management decisions. A negative result may occur with  improper specimen collection/handling, submission of specimen other than nasopharyngeal swab, presence of viral mutation(s) within the areas targeted by this assay, and inadequate number of viral copies (<131 copies/mL). A negative result must be combined with clinical observations, patient history, and epidemiological information. The expected result is Negative.  Fact Sheet for Patients:  PinkCheek.be  Fact Sheet for Healthcare Providers:  GravelBags.it  This test is no t yet approved or cleared by the  [place] FDA and  has been authorized for detection and/or diagnosis of SARS-CoV-2 by FDA under an Emergency Use Authorization (EUA). This EUA will remain  in effect (meaning this test can be used) for the duration of the COVID-19 declaration under Section 564(b)(1) of the Act, 21 U.S.C. section 360bbb-3(b)(1), unless the authorization is terminated or revoked sooner.     Influenza A by PCR NEGATIVE NEGATIVE Final   Influenza B by PCR NEGATIVE NEGATIVE Final    Comment: (NOTE) The Xpert Xpress SARS-CoV-2/FLU/RSV assay is intended as an aid in  the diagnosis of influenza from Nasopharyngeal swab specimens and  should not be used as a sole basis for treatment. Nasal washings and  aspirates are unacceptable for Xpert Xpress SARS-CoV-2/FLU/RSV  testing.  Fact Sheet for Patients: PinkCheek.be  Fact Sheet for Healthcare Providers: GravelBags.it  This test is not yet approved or cleared by the [place] FDA and  has been authorized for detection and/or diagnosis of SARS-CoV-2 by  FDA under an Emergency Use Authorization (EUA). This EUA will remain  in effect (meaning this test can be used) for the duration of the  Covid-19 declaration under Section 564(b)(1) of the Act, 21  U.S.C. section 360bbb-3(b)(1), unless the authorization is  terminated or revoked. Performed at [facility], [place] [address] [place], [place] [address]          Radiology Studies: DG Tibia/Fibula Right  Result Date: 02/16/2020 CLINICAL DATA:  Trauma 4 weeks ago and again 2 weeks ago. Swelling and redness. EXAM: RIGHT TIBIA AND FIBULA - 2 VIEW COMPARISON:  None. FINDINGS: No evidence of fracture or dislocation. No sign of osteomyelitis. Clips related to previous vascular surgery. IMPRESSION: No acute or traumatic finding. Electronically Signed   By: Nelson Chimes M.D.   On: 02/16/2020 13:53   VAS [place] LOWER EXTREMITY VENOUS  (DVT)  Result Date: 02/16/2020  Lower Venous DVTStudy Indications: Pain, and Erythema.  Risk Factors: None identified. Limitations: Poor ultrasound/tissue interface. Comparison Study: No prior studies. Performing Technologist: Oliver Hum RVT  Examination Guidelines: A complete evaluation includes B-mode imaging, spectral Doppler, color Doppler,  and power Doppler as needed of all accessible portions of each vessel. Bilateral testing is considered an integral part of a complete examination. Limited examinations for reoccurring indications may be performed as noted. The reflux portion of the exam is performed with the patient in reverse Trendelenburg.  +---------+---------------+---------+-----------+----------+--------------+ RIGHT    CompressibilityPhasicitySpontaneityPropertiesThrombus Aging +---------+---------------+---------+-----------+----------+--------------+ CFV      Full           Yes      Yes                                 +---------+---------------+---------+-----------+----------+--------------+ SFJ      Full                                                        +---------+---------------+---------+-----------+----------+--------------+ FV Prox  Full                                                        +---------+---------------+---------+-----------+----------+--------------+ FV Mid   Full                                                        +---------+---------------+---------+-----------+----------+--------------+ FV DistalFull                                                        +---------+---------------+---------+-----------+----------+--------------+ PFV      Full                                                        +---------+---------------+---------+-----------+----------+--------------+ POP      Full           Yes      Yes                                  +---------+---------------+---------+-----------+----------+--------------+ PTV      Full                                                        +---------+---------------+---------+-----------+----------+--------------+ PERO     Full                                                        +---------+---------------+---------+-----------+----------+--------------+  GSV      Full                                                        +---------+---------------+---------+-----------+----------+--------------+   +----+---------------+---------+-----------+----------+--------------+ LEFTCompressibilityPhasicitySpontaneityPropertiesThrombus Aging +----+---------------+---------+-----------+----------+--------------+ CFV Full           Yes      Yes                                 +----+---------------+---------+-----------+----------+--------------+     Summary: RIGHT: - There is no evidence of deep vein thrombosis in the lower extremity.  - No cystic structure found in the popliteal fossa.  LEFT: - No evidence of common femoral vein obstruction.  *See table(s) above for measurements and observations. Electronically signed by Monica Martinez MD on 02/16/2020 at 3:18:21 PM.    Final         Scheduled Meds: . amLODipine  2.5 mg Oral Daily  . aspirin  81 mg Oral Daily  . atorvastatin  20 mg Oral q1800  . enoxaparin (LOVENOX) injection  40 mg Subcutaneous Q24H  . metoprolol tartrate  12.5 mg Oral BID   Continuous Infusions: . sodium chloride Stopped (02/16/20 0015)  . ampicillin-sulbactam (UNASYN) IV 3 g (02/16/20 1545)     LOS: 1 day    Time spent: 35 minutes    Irine Seal, MD [facility]   To contact the attending provider between 7A-7P or the covering provider during after hours 7P-7A, please log into the web site [email or web address] and access using universal [place] password for that web site. If you do not have the password, please call the  hospital operator.  02/16/2020, 4:59 PM

## 2020-02-16 NOTE — Plan of Care (Signed)
  Problem: Education: Goal: Knowledge of General Education information will improve Description Including pain rating scale, medication(s)/side effects and non-pharmacologic comfort measures Outcome: Progressing   

## 2020-02-17 LAB — BASIC METABOLIC PANEL
Anion gap: 10 (ref 5–15)
BUN: 9 mg/dL (ref 8–23)
CO2: 26 mmol/L (ref 22–32)
Calcium: 8.5 mg/dL — ABNORMAL LOW (ref 8.9–10.3)
Chloride: 100 mmol/L (ref 98–111)
Creatinine, Ser: 0.8 mg/dL (ref 0.61–1.24)
GFR, Estimated: >60 mL/min (ref >60)
Glucose, Bld: 119 mg/dL — ABNORMAL HIGH (ref 70–99)
Potassium: 3.5 mmol/L (ref 3.5–5.1)
Sodium: 136 mmol/L (ref 135–145)

## 2020-02-17 LAB — CBC WITH DIFFERENTIAL/PLATELET
Abs Immature Granulocytes: 0.03 10*3/uL (ref 0.00–0.07)
Basophils Absolute: 0.1 10*3/uL (ref 0.0–0.1)
Basophils Relative: 1 %
Eosinophils Absolute: 0.4 10*3/uL (ref 0.0–0.5)
Eosinophils Relative: 4 %
HCT: 44.6 % (ref 39.0–52.0)
Hemoglobin: 14.9 g/dL (ref 13.0–17.0)
Immature Granulocytes: 0 %
Lymphocytes Relative: 18 %
Lymphs Abs: 1.5 10*3/uL (ref 0.7–4.0)
MCH: 30.7 pg (ref 26.0–34.0)
MCHC: 33.4 g/dL (ref 30.0–36.0)
MCV: 92 fL (ref 80.0–100.0)
Monocytes Absolute: 0.8 10*3/uL (ref 0.1–1.0)
Monocytes Relative: 9 %
Neutro Abs: 5.6 10*3/uL (ref 1.7–7.7)
Neutrophils Relative %: 68 %
Platelets: 168 10*3/uL (ref 150–400)
RBC: 4.85 MIL/uL (ref 4.22–5.81)
RDW: 13.2 % (ref 11.5–15.5)
WBC: 8.3 10*3/uL (ref 4.0–10.5)
nRBC: 0 % (ref 0.0–0.2)

## 2020-02-17 LAB — MAGNESIUM: Magnesium: 2 mg/dL (ref 1.7–2.4)

## 2020-02-17 MED ORDER — TRAMADOL HCL 50 MG PO TABS
100.0000 mg | ORAL_TABLET | Freq: Four times a day (QID) | ORAL | Status: DC | PRN
  Administered 2020-02-17 – 2020-02-18 (×5): 100 mg via ORAL
  Filled 2020-02-17 (×5): qty 2

## 2020-02-17 MED ORDER — PREGABALIN 25 MG PO CAPS
25.0000 mg | ORAL_CAPSULE | Freq: Every day | ORAL | Status: DC
  Administered 2020-02-17 – 2020-02-18 (×2): 25 mg via ORAL
  Filled 2020-02-17 (×2): qty 1

## 2020-02-17 MED ORDER — ZOLPIDEM TARTRATE 5 MG PO TABS
5.0000 mg | ORAL_TABLET | Freq: Every evening | ORAL | Status: DC | PRN
  Administered 2020-02-17: 5 mg via ORAL
  Filled 2020-02-17: qty 1

## 2020-02-17 MED ORDER — POTASSIUM CHLORIDE CRYS ER 20 MEQ PO TBCR
40.0000 meq | EXTENDED_RELEASE_TABLET | Freq: Once | ORAL | Status: AC
  Administered 2020-02-17: 40 meq via ORAL
  Filled 2020-02-17: qty 2

## 2020-02-17 NOTE — Evaluation (Signed)
Physical Therapy Evaluation Patient Details Name: Sean Bridges MRN: [id number] DOB: 1946/02/27 Today's Date: 02/17/2020   History of Present Illness  74 yo male with PMH of COPD, CAD s/p CABG who presented to the ED with complaints of right leg pain, swelling and erythema.  Clinical Impression  Pt admitted with above diagnosis. Pt independent with mobility and walking 3 miles/day for exercise PTA. Pt able to rise from bed and mobility around room with min guard assist to steady and using RW with hop to gait pattern and RLE elevated off floor. Pt with varying pain complaints, initially very painful with WB and dependent positioning, but improves with time. Overall, pt strong and safe with mobility but limited in ambulation distance due to pain, pt would benefit from continued acute therapy for gait and stair training. Pt currently with functional limitations due to the deficits listed below (see PT Problem List). Pt will benefit from skilled PT to increase their independence and safety with mobility to allow discharge to the venue listed below.       Follow Up Recommendations No PT follow up    Equipment Recommendations  None recommended by PT    Recommendations for Other Services       Precautions / Restrictions Precautions Precautions: Fall Restrictions Weight Bearing Restrictions: No      Mobility  Bed Mobility Overal bed mobility: Modified Independent  General bed mobility comments: HOB elevated, able to come to sitting without physical assist or cues    Transfers Overall transfer level: Needs assistance Equipment used: Rolling walker (2 wheeled) Transfers: Sit to/from Stand Sit to Stand: Min guard    General transfer comment: min G to steady with initial rise from EOB  Ambulation/Gait Ambulation/Gait assistance: Min guard Gait Distance (Feet): 15 Feet Assistive device: Rolling walker (2 wheeled)   Gait velocity: decreased   General Gait Details: hop to  pattern with RLE off of floor, good UE strength and balanace with steps, able to gently rest RLE on floor  Stairs            Wheelchair Mobility    Modified Rankin (Stroke Patients Only)       Balance Overall balance assessment: No apparent balance deficits (not formally assessed)          Pertinent Vitals/Pain Pain Assessment: 0-10 Pain Score:  (0 at rest, 9/10 with initial WB, 6/10 after ~3 minutes standing) Pain Descriptors / Indicators: Throbbing Pain Intervention(s): Limited activity within patient's tolerance;Monitored during session    [place] expects to be discharged to:: Private residence Living Arrangements: Spouse/significant other Available Help at Discharge: Family;Available PRN/intermittently Type of Home: House Home Access: Stairs to enter Entrance Stairs-Rails: None Entrance Stairs-Number of Steps: 3 Home Layout: Two level;Full bath on main level Home Equipment: Walker - 2 wheels;Walker - 4 wheels;Cane - single point;Bedside commode;Shower seat;Wheelchair - manual      Prior Function Level of Independence: Independent         Comments: Pt reports independent with ADLs, community ambulation without AD, drives, walks 3 miles/day for exercise.     Hand Dominance   Dominant Hand: Right    Extremity/Trunk Assessment   Upper Extremity Assessment Upper Extremity Assessment: Defer to OT evaluation    Lower Extremity Assessment Lower Extremity Assessment: Overall WFL for tasks assessed;RLE deficits/detail RLE Deficits / Details: RLE with noted erythema/swelling in lower leg, AROM throughout WNL, strength grossly 4/5    Cervical / Trunk Assessment Cervical / Trunk Assessment: Normal  Communication  Communication: No difficulties  Cognition Arousal/Alertness: Awake/alert Behavior During Therapy: WFL for tasks assessed/performed Overall Cognitive Status: Within Functional Limits for tasks assessed     General Comments       Exercises     Assessment/Plan    PT Assessment Patient needs continued PT services  PT Problem List Decreased activity tolerance;Decreased balance;Pain;Decreased skin integrity       PT Treatment Interventions DME instruction;Gait training;Stair training;Functional mobility training;Therapeutic activities;Therapeutic exercise;Balance training;Patient/family education    PT Goals (Current goals can be found in the Care Plan section)  Acute Rehab PT Goals Patient Stated Goal: return home PT Goal Formulation: With patient Time For Goal Achievement: 03/02/20 Potential to Achieve Goals: Good    Frequency Min 3X/week   Barriers to discharge        Co-evaluation               AM-PAC PT "6 Clicks" Mobility  Outcome Measure Help needed turning from your back to your side while in a flat bed without using bedrails?: None Help needed moving from lying on your back to sitting on the side of a flat bed without using bedrails?: None Help needed moving to and from a bed to a chair (including a wheelchair)?: None Help needed standing up from a chair using your arms (e.g., wheelchair or bedside chair)?: None Help needed to walk in hospital room?: A Little Help needed climbing 3-5 steps with a railing? : A Little 6 Click Score: 22    End of Session   Activity Tolerance: Patient tolerated treatment well;Patient limited by pain Patient left: in bed;with call bell/phone within reach;with family/visitor present Nurse Communication: Mobility status PT Visit Diagnosis: Unsteadiness on feet (R26.81);Other abnormalities of gait and mobility (R26.89);Pain Pain - Right/Left: Right Pain - part of body: Leg    Time: 1351-1405 PT Time Calculation (min) (ACUTE ONLY): 14 min   Charges:   PT Evaluation $PT Eval Low Complexity: 1 Low          Tori Xitlali Kastens PT, DPT 02/17/20, 2:23 PM

## 2020-02-17 NOTE — Care Management Important Message (Signed)
Important Message  Patient Details IM Letter given to the Patient Name: Sean Bridges MRN: [id number] Date of Birth: 01-Jan-1946   Medicare Important Message Given:  Yes     Kerin Salen 02/17/2020, 12:31 PM

## 2020-02-17 NOTE — Progress Notes (Signed)
Chaplain engaged in initial visit with Sean Bridges and explained her role. Chaplain offered support.  Chaplain explained that he would be discharging tomorrow.    Chaplain will follow-up as needed.     02/17/20 1500  Clinical Encounter Type  Visited With Patient  Visit Type Initial

## 2020-02-17 NOTE — Progress Notes (Signed)
OT Cancellation Note  Patient Details Name: Sean Bridges MRN: [id number] DOB: [birth year]   Cancelled Treatment:    Reason Eval/Treat Not Completed: OT screened, no needs identified, will sign off. Patient reports independence with ADLs and ambulating to bathroom independently. Patient reports only limited with walking due to pain.  Hend Mccarrell L Daryana Whirley 02/17/2020, 2:41 PM

## 2020-02-17 NOTE — Plan of Care (Signed)

## 2020-02-17 NOTE — Progress Notes (Addendum)
PROGRESS NOTE    Sean Bridges  MEQ:[id number] DOB: 09/11/1945 DOA: 02/15/2020 PCP: [name], MD   Chief Complaint  Patient presents with  . Leg Swelling    Brief Narrative:  HPI Per Dr. Neysa Bonito This is a 73 year old male with past medical history of COPD, CAD s/p CABG who presented to the ED with complaints of right leg pain, swelling and erythema which she noticed today.  States that he injured his right shin about 4 months ago which has not ever healed completely and then about a week ago believes he injured it again on something but is not sure what.  Reports that his 2 small dogs have been licking at it occasionally over the past week, though he has tried to stop them.  He reports that yesterday he had a fever up to 102F as well as an episode of vomiting and lethargy yesterday but did not notice any rash until this morning which prompted him to come to the ED.  No chest pain, diarrhea, constipation or any other complaints.  Never had this before.  ED Course: Afebrile and hemodynamically stable on room air though the patient reported that he took antipyretics prior to coming to the ED.  Notable labs: WBC 13.3.  He was given Ancef and 1 L NS bolus in the ED.   Assessment & Plan:   Principal Problem:   Cellulitis Active Problems:   CAD (coronary artery disease)   HTN (hypertension)  1 nonpurulent right lower extremity cellulitis Questionable etiology.  Patient currently afebrile however noted to have had a fever of 102 prior to admission and also noted to have a leukocytosis.  Patient with complaints of pain in his right lower extremity with difficulty bearing weight last night which was new to patient as he stated was able to ambulate prior to admission.  Per admission H&P patient has 2 small dogs that had been licking it occasionally over the past week.  Plain films of the right tib-fib negative for abscess formation or fracture.  Lower extremity Dopplers negative  for DVT.  IV Rocephin was changed to IV Unasyn for better coverage as patient noted and had dogs licking his wound.  Increase Ultram 200 mg every 6 hours as needed pain.  Supportive care.  Follow.   2.  Hypertension Norvasc, Lopressor.   3.  Coronary artery disease status post CABG Continue aspirin, statin, beta-blocker.  Outpatient follow-up.    DVT prophylaxis: Lovenox Code Status: Full Family Communication: Updated patient.  Updated girlfriend per patient request via telephone. Disposition:   Status is: Inpatient    Dispo: The patient is from: Home              Anticipated d/c is to: Home              Anticipated d/c date is: 2 to 3 days.              Patient currently on IV antibiotics.  Not stable for discharge.       Consultants:   None  Procedures:   Plain films of the tib-fib 02/16/2020  Lower extremity Dopplers 02/16/2020  Antimicrobials:   IV Rocephin 02/15/2020>>>> 02/16/2020  IV Unasyn 02/16/2020   Subjective: Laying in bed.  Complaining of some tingling in the right calf area as well as still with some pain in the right lower extremity when trying to put pressure on his leg.  No chest pain.  No shortness of breath.   Objective: Vitals:  02/16/20 1630 02/16/20 1743 02/16/20 2047 02/17/20 0516  BP:  (!) 101/58 118/65 122/68  Pulse:  75 68 61  Resp:  18 16 16   Temp:  98.7 F (37.1 C) 97.6 F (36.4 C) 98.1 F (36.7 C)  TempSrc:  Oral Oral   SpO2:  94% 96% 94%  Weight: 82.6 kg     Height: 5' 7.99" (1.727 m)       Intake/Output Summary (Last 24 hours) at 02/17/2020 1041 Last data filed at 02/17/2020 0950 Gross per 24 hour  Intake 1360 ml  Output --  Net 1360 ml   Filed Weights   02/16/20 1630  Weight: 82.6 kg    Examination:  General exam: NAD Respiratory system: CTAB.  No wheezes, no crackles, no rhonchi.  Normal respiratory effort.   Cardiovascular system: RRR no murmurs rubs or gallops.  No JVD.  No lower extremity edema.    Gastrointestinal system: Abdomen soft, nontender, nondistended, positive bowel sounds.  No rebound.  No guarding. Central nervous system: Alert and oriented. No focal neurological deficits. Extremities: Symmetric 5 x 5 power. Skin: Right lower extremity with erythema, warmth, tenderness to palpation.  Red streak on right thigh improving. Psychiatry: Judgement and insight appear normal. Mood & affect appropriate.           Data Reviewed: I have personally reviewed following labs and imaging studies  CBC: Recent Labs  Lab 02/15/20 1345 02/16/20 0258 02/17/20 0807  WBC 13.3* 10.2 8.3  NEUTROABS 11.6*  --  5.6  HGB 16.4 14.6 14.9  HCT 48.0 43.5 44.6  MCV 90.9 91.8 92.0  PLT 181 152 559    Basic Metabolic Panel: Recent Labs  Lab 02/15/20 1345 02/16/20 0258 02/17/20 0807  NA 136 134* 136  K 3.7 3.8 3.5  CL 98 101 100  CO2 25 24 26   GLUCOSE 140* 136* 119*  BUN 16 13 9   CREATININE 1.00 0.95 0.80  CALCIUM 9.4 8.3* 8.5*  MG  --   --  2.0    GFR: Estimated Creatinine Clearance: 87.5 mL/min (by C-G formula based on SCr of 0.8 mg/dL).  Liver Function Tests: Recent Labs  Lab 02/15/20 1345  AST 20  ALT 26  ALKPHOS 73  BILITOT 1.6*  PROT 7.9  ALBUMIN 4.4    CBG: No results for input(s): GLUCAP in the last 168 hours.   Recent Results (from the past 240 hour(s))  Culture, blood (routine x 2)     Status: None (Preliminary result)   Collection Time: 02/15/20  1:45 PM   Specimen: BLOOD LEFT FOREARM  Result Value Ref Range Status   Specimen Description   Final    BLOOD LEFT FOREARM Performed at [facility] [address], [place], [place] [address]    Special Requests   Final    BOTTLES DRAWN AEROBIC AND ANAEROBIC Blood Culture adequate volume Performed at [facility] [address], [place], [place] [address]    Culture   Final    NO GROWTH < 24 HOURS Performed at [facility] [address]  [place], [place] [address]    Report Status PENDING  Incomplete  Culture, blood (routine x 2)     Status: None (Preliminary result)   Collection Time: 02/15/20  1:55 PM   Specimen: BLOOD RIGHT FOREARM  Result Value Ref Range Status   Specimen Description   Final    BLOOD RIGHT FOREARM Performed at [facility]  [address] [place], [place] [address]    Special Requests   Final    BOTTLES DRAWN AEROBIC AND ANAEROBIC Blood Culture adequate volume Performed at [facility] [address] [place], [place] [address]    Culture   Final    NO GROWTH < 24 HOURS Performed at [facility] [address] [place], [place] [address]    Report Status PENDING  Incomplete  Respiratory Panel by RT PCR (Flu A&B, Covid) - Nasopharyngeal Swab     Status: None   Collection Time: 02/15/20  3:00 PM   Specimen: Nasopharyngeal Swab  Result Value Ref Range Status   SARS Coronavirus 2 by RT PCR NEGATIVE NEGATIVE Final    Comment: (NOTE) SARS-CoV-2 target nucleic acids are NOT DETECTED.  The SARS-CoV-2 RNA is generally detectable in upper respiratoy specimens during the acute phase of infection. The lowest concentration of SARS-CoV-2 viral copies this assay can detect is 131 copies/mL. A negative result does not preclude SARS-Cov-2 infection and should not be used as the sole basis for treatment or other patient management decisions. A negative result may occur with  improper specimen collection/handling, submission of specimen other than nasopharyngeal swab, presence of viral mutation(s) within the areas targeted by this assay, and inadequate number of viral copies (<131 copies/mL). A negative result must be combined with clinical observations, patient history, and epidemiological information. The expected result is Negative.  Fact Sheet for Patients:  PinkCheek.be  Fact Sheet for Healthcare Providers:   GravelBags.it  This test is no t yet approved or cleared by the [place] FDA and  has been authorized for detection and/or diagnosis of SARS-CoV-2 by FDA under an Emergency Use Authorization (EUA). This EUA will remain  in effect (meaning this test can be used) for the duration of the COVID-19 declaration under Section 564(b)(1) of the Act, 21 U.S.C. section 360bbb-3(b)(1), unless the authorization is terminated or revoked sooner.     Influenza A by PCR NEGATIVE NEGATIVE Final   Influenza B by PCR NEGATIVE NEGATIVE Final    Comment: (NOTE) The Xpert Xpress SARS-CoV-2/FLU/RSV assay is intended as an aid in  the diagnosis of influenza from Nasopharyngeal swab specimens and  should not be used as a sole basis for treatment. Nasal washings and  aspirates are unacceptable for Xpert Xpress SARS-CoV-2/FLU/RSV  testing.  Fact Sheet for Patients: PinkCheek.be  Fact Sheet for Healthcare Providers: GravelBags.it  This test is not yet approved or cleared by the [place] FDA and  has been authorized for detection and/or diagnosis of SARS-CoV-2 by  FDA under an Emergency Use Authorization (EUA). This EUA will remain  in effect (meaning this test can be used) for the duration of the  Covid-19 declaration under Section 564(b)(1) of the Act, 21  U.S.C. section 360bbb-3(b)(1), unless the authorization is  terminated or revoked. Performed at [facility], [place] [address] [place], [place] [address]          Radiology Studies: DG Tibia/Fibula Right  Result Date: 02/16/2020 CLINICAL DATA:  Trauma 4 weeks ago and again 2 weeks ago. Swelling and redness. EXAM: RIGHT TIBIA AND FIBULA - 2 VIEW COMPARISON:  None. FINDINGS: No evidence of fracture or dislocation. No sign of osteomyelitis. Clips related to previous vascular surgery. IMPRESSION: No acute or traumatic finding.  Electronically Signed   By: Nelson Chimes M.D.   On: 02/16/2020 13:53   VAS [place] LOWER EXTREMITY VENOUS (DVT)  Result Date: 02/16/2020  Lower Venous DVTStudy Indications:  Pain, and Erythema.  Risk Factors: None identified. Limitations: Poor ultrasound/tissue interface. Comparison Study: No prior studies. Performing Technologist: Oliver Hum RVT  Examination Guidelines: A complete evaluation includes B-mode imaging, spectral Doppler, color Doppler, and power Doppler as needed of all accessible portions of each vessel. Bilateral testing is considered an integral part of a complete examination. Limited examinations for reoccurring indications may be performed as noted. The reflux portion of the exam is performed with the patient in reverse Trendelenburg.  +---------+---------------+---------+-----------+----------+--------------+ RIGHT    CompressibilityPhasicitySpontaneityPropertiesThrombus Aging +---------+---------------+---------+-----------+----------+--------------+ CFV      Full           Yes      Yes                                 +---------+---------------+---------+-----------+----------+--------------+ SFJ      Full                                                        +---------+---------------+---------+-----------+----------+--------------+ FV Prox  Full                                                        +---------+---------------+---------+-----------+----------+--------------+ FV Mid   Full                                                        +---------+---------------+---------+-----------+----------+--------------+ FV DistalFull                                                        +---------+---------------+---------+-----------+----------+--------------+ PFV      Full                                                        +---------+---------------+---------+-----------+----------+--------------+ POP      Full           Yes      Yes                                  +---------+---------------+---------+-----------+----------+--------------+ PTV      Full                                                        +---------+---------------+---------+-----------+----------+--------------+ PERO     Full                                                        +---------+---------------+---------+-----------+----------+--------------+  GSV      Full                                                        +---------+---------------+---------+-----------+----------+--------------+   +----+---------------+---------+-----------+----------+--------------+ LEFTCompressibilityPhasicitySpontaneityPropertiesThrombus Aging +----+---------------+---------+-----------+----------+--------------+ CFV Full           Yes      Yes                                 +----+---------------+---------+-----------+----------+--------------+     Summary: RIGHT: - There is no evidence of deep vein thrombosis in the lower extremity.  - No cystic structure found in the popliteal fossa.  LEFT: - No evidence of common femoral vein obstruction.  *See table(s) above for measurements and observations. Electronically signed by Monica Martinez MD on 02/16/2020 at 3:18:21 PM.    Final         Scheduled Meds: . amLODipine  2.5 mg Oral Daily  . aspirin  81 mg Oral Daily  . atorvastatin  20 mg Oral q1800  . enoxaparin (LOVENOX) injection  40 mg Subcutaneous Q24H  . metoprolol tartrate  12.5 mg Oral BID  . potassium chloride  40 mEq Oral Once  . pregabalin  25 mg Oral Daily   Continuous Infusions: . sodium chloride Stopped (02/16/20 0015)  . ampicillin-sulbactam (UNASYN) IV 3 g (02/17/20 0801)     LOS: 2 days    Time spent: 35 minutes    Irine Seal, MD [facility]   To contact the attending provider between 7A-7P or the covering provider during after hours 7P-7A, please log into the web site [email or web address] and access  using universal  password for that web site. If you do not have the password, please call the hospital operator.  02/17/2020, 10:41 AM

## 2020-02-18 LAB — BASIC METABOLIC PANEL
Anion gap: 9 (ref 5–15)
BUN: 11 mg/dL (ref 8–23)
CO2: 25 mmol/L (ref 22–32)
Calcium: 8.4 mg/dL — ABNORMAL LOW (ref 8.9–10.3)
Chloride: 102 mmol/L (ref 98–111)
Creatinine, Ser: 0.82 mg/dL (ref 0.61–1.24)
GFR, Estimated: >60 mL/min (ref >60)
Glucose, Bld: 127 mg/dL — ABNORMAL HIGH (ref 70–99)
Potassium: 3.7 mmol/L (ref 3.5–5.1)
Sodium: 136 mmol/L (ref 135–145)

## 2020-02-18 MED ORDER — TRAMADOL HCL 50 MG PO TABS: 100.0000 mg | ORAL_TABLET | Freq: Four times a day (QID) | ORAL | 0 refills | Status: DC | PRN

## 2020-02-18 MED ORDER — AMOXICILLIN-POT CLAVULANATE 875-125 MG PO TABS: 1.0000 | ORAL_TABLET | Freq: Two times a day (BID) | ORAL | 0 refills | Status: DC

## 2020-02-18 MED ORDER — PREGABALIN 25 MG PO CAPS: 25.0000 mg | ORAL_CAPSULE | Freq: Every day | ORAL | 0 refills | Status: DC

## 2020-02-18 MED ORDER — AMOXICILLIN-POT CLAVULANATE 875-125 MG PO TABS: 1.0000 | ORAL_TABLET | Freq: Two times a day (BID) | ORAL | 0 refills | Status: AC

## 2020-02-18 MED ORDER — TRAMADOL HCL 50 MG PO TABS
100.0000 mg | ORAL_TABLET | Freq: Four times a day (QID) | ORAL | 0 refills | Status: DC | PRN
Start: 2020-02-18 — End: 2020-02-18

## 2020-02-18 MED ORDER — ACETAMINOPHEN 325 MG PO TABS: 650.0000 mg | ORAL_TABLET | Freq: Four times a day (QID) | ORAL | Status: DC | PRN

## 2020-02-18 NOTE — Plan of Care (Signed)
Patient discharged home in stable condition 

## 2020-02-18 NOTE — Discharge Summary (Signed)
Physician Discharge Summary  Sean Bridges LGX:[id number] DOB: 12-14-1945 DOA: 02/15/2020  PCP: [name], MD  Admit date: 02/15/2020 Discharge date: 02/18/2020  Time spent: 45 minutes  Recommendations for Outpatient Follow-up:  1. Follow-up with [name], MD in 1 to 2 weeks.  On follow-up patient's right lower extremity cellulitis will need to be reassessed.   Discharge Diagnoses:  Principal Problem:   Cellulitis Active Problems:   CAD (coronary artery disease)   HTN (hypertension)   Discharge Condition: Stable and improved  Diet recommendation: Heart healthy  Filed Weights   02/16/20 1630  Weight: 82.6 kg    History of present illness:  HPI per Dr. Neysa Bonito  This is a 74 year old male with past medical history of COPD, CAD s/p CABG who presented to the ED with complaints of right leg pain, swelling and erythema which he noticed on day of admission.  Stated that he injured his right shin about 4 months ago which has not ever healed completely and then about a week ago believes he injured it again on something but is not sure what.  Reported that his 2 small dogs have been licking at it occasionally over the past week, though he has tried to stop them.  He reported that yesterday he had a fever up to 102F as well as an episode of vomiting and lethargy yesterday but did not notice any rash until this morning which prompted him to come to the ED.  No chest pain, diarrhea, constipation or any other complaints.  Never had this before.  ED Course: Afebrile and hemodynamically stable on room air though the patient reported that he took antipyretics prior to coming to the ED.  Notable labs: WBC 13.3.  He was given Ancef and 1 L NS bolus in the ED.  Hospital Course:  1 nonpurulent right lower extremity cellulitis Questionable etiology.  Patient remained afebrile during the hospitalization, however noted to have had a fever of 102 prior to admission and also  noted to have a leukocytosis.    Patient initially placed on IV Rocephin on admission.  Patient with complaints of pain in his right lower extremity with difficulty bearing weight after the first night of hospitalization.   Per admission H&P patient has 2 small dogs that had been licking it occasionally over the past week.  Plain films of the right tib-fib negative for abscess formation or fracture.  Lower extremity Dopplers negative for DVT.  IV Rocephin was changed to IV Unasyn for better coverage as patient noted to have had dogs licking his wound.  Patient also placed on Ultram as needed for pain control.  Lyrica 25 mg daily was also added.  Patient cellulitis improved.  Patient improved clinically.  Patient subsequently was able to bear weight with improvement with pain to the right lower extremity and ambulating in the hallway with the aid of a walker.  Patient will be discharged home on 5 more days of Augmentin to complete a 7-day course of antibiotic treatment.  Outpatient follow-up with PCP in 1 to 2 weeks.  2.  Hypertension Patient maintained on home regimen of Norvasc, Lopressor.   Blood pressures well controlled during the hospitalization.  3.  Coronary artery disease status post CABG Patient was maintained on home regimen of aspirin, statin, beta-blocker.  Outpatient follow-up.   Procedures:  Plain films of the tib-fib 02/16/2020  Lower extremity Dopplers 02/16/2020  Consultations:  None  Discharge Exam: Vitals:   02/17/20 2002 02/18/20 0527  BP: 116/65  118/72  Pulse: 64 (!) 59  Resp: 15 15  Temp: 98.7 F (37.1 C) 98.3 F (36.8 C)  SpO2: 93% 94%    General: NAD Cardiovascular: RRR Respiratory: CTAB  Discharge Instructions   Discharge Instructions    Diet - low sodium heart healthy   Complete by: As directed    Increase activity slowly   Complete by: As directed      Allergies as of 02/18/2020   No Known Allergies     Medication List    STOP taking  these medications   azithromycin 250 MG tablet Commonly known as: ZITHROMAX     TAKE these medications   acetaminophen 325 MG tablet Commonly known as: TYLENOL Take 2 tablets (650 mg total) by mouth every 6 (six) hours as needed for mild pain, moderate pain, fever or headache.   amLODipine 2.5 MG tablet Commonly known as: NORVASC Take 1 tablet (2.5 mg total) by mouth daily.   amoxicillin-clavulanate 875-125 MG tablet Commonly known as: Augmentin Take 1 tablet by mouth 2 (two) times daily for 5 days.   aspirin EC 81 MG tablet Take 81 mg by mouth daily.   atorvastatin 20 MG tablet Commonly known as: LIPITOR Take 1 tablet (20 mg total) by mouth daily at 6 PM. What changed: when to take this   metoprolol tartrate 25 MG tablet Commonly known as: LOPRESSOR Take 0.5 tablets (12.5 mg total) by mouth 2 (two) times daily.   nitroGLYCERIN 0.4 MG SL tablet Commonly known as: NITROSTAT Place 1 tablet (0.4 mg total) under the tongue every 5 (five) minutes as needed for chest pain.   pregabalin 25 MG capsule Commonly known as: LYRICA Take 1 capsule (25 mg total) by mouth daily for 5 days. Start taking on: February 19, 2020   traMADol 50 MG tablet Commonly known as: ULTRAM Take 2 tablets (100 mg total) by mouth every 6 (six) hours as needed for moderate pain.      No Known Allergies  Follow-up Information    [name], MD. Schedule an appointment as soon as possible for a visit in 1 week(s).   Specialty: Internal Medicine Why: f/u in 1-2 weeks Contact information: [address] [place] [address] [phone]                The results of significant diagnostics from this hospitalization (including imaging, microbiology, ancillary and laboratory) are listed below for reference.    Significant Diagnostic Studies: DG Tibia/Fibula Right  Result Date: 02/16/2020 CLINICAL DATA:  Trauma 4 weeks ago and again 2 weeks ago. Swelling and  redness. EXAM: RIGHT TIBIA AND FIBULA - 2 VIEW COMPARISON:  None. FINDINGS: No evidence of fracture or dislocation. No sign of osteomyelitis. Clips related to previous vascular surgery. IMPRESSION: No acute or traumatic finding. Electronically Signed   By: Nelson Chimes M.D.   On: 02/16/2020 13:53   VAS [place] LOWER EXTREMITY VENOUS (DVT)  Result Date: 02/16/2020  Lower Venous DVTStudy Indications: Pain, and Erythema.  Risk Factors: None identified. Limitations: Poor ultrasound/tissue interface. Comparison Study: No prior studies. Performing Technologist: Oliver Hum RVT  Examination Guidelines: A complete evaluation includes B-mode imaging, spectral Doppler, color Doppler, and power Doppler as needed of all accessible portions of each vessel. Bilateral testing is considered an integral part of a complete examination. Limited examinations for reoccurring indications may be performed as noted. The reflux portion of the exam is performed with the patient in reverse Trendelenburg.  +---------+---------------+---------+-----------+----------+--------------+ RIGHT  CompressibilityPhasicitySpontaneityPropertiesThrombus Aging +---------+---------------+---------+-----------+----------+--------------+ CFV      Full           Yes      Yes                                 +---------+---------------+---------+-----------+----------+--------------+ SFJ      Full                                                        +---------+---------------+---------+-----------+----------+--------------+ FV Prox  Full                                                        +---------+---------------+---------+-----------+----------+--------------+ FV Mid   Full                                                        +---------+---------------+---------+-----------+----------+--------------+ FV DistalFull                                                         +---------+---------------+---------+-----------+----------+--------------+ PFV      Full                                                        +---------+---------------+---------+-----------+----------+--------------+ POP      Full           Yes      Yes                                 +---------+---------------+---------+-----------+----------+--------------+ PTV      Full                                                        +---------+---------------+---------+-----------+----------+--------------+ PERO     Full                                                        +---------+---------------+---------+-----------+----------+--------------+ GSV      Full                                                        +---------+---------------+---------+-----------+----------+--------------+   +----+---------------+---------+-----------+----------+--------------+  LEFTCompressibilityPhasicitySpontaneityPropertiesThrombus Aging +----+---------------+---------+-----------+----------+--------------+ CFV Full           Yes      Yes                                 +----+---------------+---------+-----------+----------+--------------+     Summary: RIGHT: - There is no evidence of deep vein thrombosis in the lower extremity.  - No cystic structure found in the popliteal fossa.  LEFT: - No evidence of common femoral vein obstruction.  *See table(s) above for measurements and observations. Electronically signed by Monica Martinez MD on 02/16/2020 at 3:18:21 PM.    Final     Microbiology: Recent Results (from the past 240 hour(s))  Culture, blood (routine x 2)     Status: None (Preliminary result)   Collection Time: 02/15/20  1:45 PM   Specimen: BLOOD LEFT FOREARM  Result Value Ref Range Status   Specimen Description   Final    BLOOD LEFT FOREARM Performed at [facility], [place] [address], [place], [place] [address]    Special Requests   Final     BOTTLES DRAWN AEROBIC AND ANAEROBIC Blood Culture adequate volume Performed at [facility] [address], [place], [place] [address]    Culture   Final    NO GROWTH 2 DAYS Performed at [facility] [address] [place], [place] [address]    Report Status PENDING  Incomplete  Culture, blood (routine x 2)     Status: None (Preliminary result)   Collection Time: 02/15/20  1:55 PM   Specimen: BLOOD RIGHT FOREARM  Result Value Ref Range Status   Specimen Description   Final    BLOOD RIGHT FOREARM Performed at [facility] [address] [place], [place] [address]    Special Requests   Final    BOTTLES DRAWN AEROBIC AND ANAEROBIC Blood Culture adequate volume Performed at [facility] [address], [place], [place] [address]    Culture   Final    NO GROWTH 2 DAYS Performed at [facility] [address], [place], [place] [address]    Report Status PENDING  Incomplete  Respiratory Panel by RT PCR (Flu A&B, Covid) - Nasopharyngeal Swab     Status: None   Collection Time: 02/15/20  3:00 PM   Specimen: Nasopharyngeal Swab  Result Value Ref Range Status   SARS Coronavirus 2 by RT PCR NEGATIVE NEGATIVE Final    Comment: (NOTE) SARS-CoV-2 target nucleic acids are NOT DETECTED.  The SARS-CoV-2 RNA is generally detectable in upper respiratoy specimens during the acute phase of infection. The lowest concentration of SARS-CoV-2 viral copies this assay can detect is 131 copies/mL. A negative result does not preclude SARS-Cov-2 infection and should not be used as the sole basis for treatment or other patient management decisions. A negative result may occur with  improper specimen collection/handling, submission of specimen other than nasopharyngeal swab, presence of viral mutation(s) within the areas targeted by this assay, and inadequate number of viral copies (<131 copies/mL). A negative result must be  combined with clinical observations, patient history, and epidemiological information. The expected result is Negative.  Fact Sheet for Patients:  PinkCheek.be  Fact Sheet for Healthcare Providers:  GravelBags.it  This test is no t yet approved or cleared by the [place] FDA and  has been authorized for detection and/or diagnosis of SARS-CoV-2 by FDA  under an Emergency Use Authorization (EUA). This EUA will remain  in effect (meaning this test can be used) for the duration of the COVID-19 declaration under Section 564(b)(1) of the Act, 21 U.S.C. section 360bbb-3(b)(1), unless the authorization is terminated or revoked sooner.     Influenza A by PCR NEGATIVE NEGATIVE Final   Influenza B by PCR NEGATIVE NEGATIVE Final    Comment: (NOTE) The Xpert Xpress SARS-CoV-2/FLU/RSV assay is intended as an aid in  the diagnosis of influenza from Nasopharyngeal swab specimens and  should not be used as a sole basis for treatment. Nasal washings and  aspirates are unacceptable for Xpert Xpress SARS-CoV-2/FLU/RSV  testing.  Fact Sheet for Patients: PinkCheek.be  Fact Sheet for Healthcare Providers: GravelBags.it  This test is not yet approved or cleared by the [place] FDA and  has been authorized for detection and/or diagnosis of SARS-CoV-2 by  FDA under an Emergency Use Authorization (EUA). This EUA will remain  in effect (meaning this test can be used) for the duration of the  Covid-19 declaration under Section 564(b)(1) of the Act, 21  U.S.C. section 360bbb-3(b)(1), unless the authorization is  terminated or revoked. Performed at [facility], [place] [address] [place], [place] [address]      Labs: Basic Metabolic Panel: Recent Labs  Lab 02/15/20 1345 02/16/20 0258 02/17/20 0807 02/18/20 0311  NA 136 134* 136 136  K 3.7 3.8 3.5 3.7   CL 98 101 100 102  CO2 25 24 26 25   GLUCOSE 140* 136* 119* 127*  BUN 16 13 9 11   CREATININE 1.00 0.95 0.80 0.82  CALCIUM 9.4 8.3* 8.5* 8.4*  MG  --   --  2.0  --    Liver Function Tests: Recent Labs  Lab 02/15/20 1345  AST 20  ALT 26  ALKPHOS 73  BILITOT 1.6*  PROT 7.9  ALBUMIN 4.4   No results for input(s): LIPASE, AMYLASE in the last 168 hours. No results for input(s): AMMONIA in the last 168 hours. CBC: Recent Labs  Lab 02/15/20 1345 02/16/20 0258 02/17/20 0807  WBC 13.3* 10.2 8.3  NEUTROABS 11.6*  --  5.6  HGB 16.4 14.6 14.9  HCT 48.0 43.5 44.6  MCV 90.9 91.8 92.0  PLT 181 152 168   Cardiac Enzymes: No results for input(s): CKTOTAL, CKMB, CKMBINDEX, TROPONINI in the last 168 hours. BNP: BNP (last 3 results) No results for input(s): BNP in the last 8760 hours.  ProBNP (last 3 results) No results for input(s): PROBNP in the last 8760 hours.  CBG: No results for input(s): GLUCAP in the last 168 hours.     Signed:  Irine Seal MD.  [facility] 02/18/2020, 11:56 AM

## 2020-02-18 NOTE — Progress Notes (Signed)
Physical Therapy Discharge Patient Details Name: Sean Bridges MRN: [id number] DOB: March 12, 1946 Today's Date: 02/18/2020 Time:  -     Patient discharged from PT services secondary to goals met and no further PT needs identified.  Please see latest therapy progress note for current level of functioning and progress toward goals.    Progress and discharge plan discussed with patient and/or caregiver: Patient/Caregiver agrees with plan  Pt reports he has been ambulating in hallway and no longer requires acute PT at this time.  Pt has a RW at home if needed.  PT to sign off per pt request.     York Ram E 02/18/2020, 10:59 AM  Arlyce Dice, DPT Acute Rehabilitation Services Pager: [phone] Office: [phone]

## 2020-02-20 LAB — CULTURE, BLOOD (ROUTINE X 2)
Culture: NO GROWTH
Culture: NO GROWTH
Special Requests: ADEQUATE
Special Requests: ADEQUATE

## 2020-02-23 DIAGNOSIS — L0390 Cellulitis, unspecified: Secondary | ICD-10-CM | POA: Diagnosis not present

## 2020-02-23 DIAGNOSIS — D72829 Elevated white blood cell count, unspecified: Secondary | ICD-10-CM | POA: Diagnosis not present

## 2020-02-24 DIAGNOSIS — L0390 Cellulitis, unspecified: Secondary | ICD-10-CM | POA: Diagnosis not present

## 2020-02-24 DIAGNOSIS — D72829 Elevated white blood cell count, unspecified: Secondary | ICD-10-CM | POA: Diagnosis not present

## 2020-04-26 DIAGNOSIS — H524 Presbyopia: Secondary | ICD-10-CM | POA: Diagnosis not present

## 2020-04-26 DIAGNOSIS — H468 Other optic neuritis: Secondary | ICD-10-CM | POA: Diagnosis not present

## 2020-04-26 DIAGNOSIS — R7309 Other abnormal glucose: Secondary | ICD-10-CM | POA: Diagnosis not present

## 2020-04-26 DIAGNOSIS — H25013 Cortical age-related cataract, bilateral: Secondary | ICD-10-CM | POA: Diagnosis not present

## 2020-04-26 DIAGNOSIS — H2513 Age-related nuclear cataract, bilateral: Secondary | ICD-10-CM | POA: Diagnosis not present

## 2020-05-30 DIAGNOSIS — M65322 Trigger finger, left index finger: Secondary | ICD-10-CM | POA: Diagnosis not present
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
## 2020-06-18 ENCOUNTER — Encounter: Payer: Self-pay | Admitting: Cardiology

## 2020-06-18 ENCOUNTER — Ambulatory Visit: Payer: PPO | Admitting: Cardiology

## 2020-06-18 ENCOUNTER — Other Ambulatory Visit: Payer: Self-pay

## 2020-06-18 VITALS — BP 112/68 | HR 58 | Temp 97.9°F | Resp 16 | Ht 67.0 in | Wt 183.2 lb

## 2020-06-18 DIAGNOSIS — I10 Essential (primary) hypertension: Secondary | ICD-10-CM

## 2020-06-18 DIAGNOSIS — Z951 Presence of aortocoronary bypass graft: Secondary | ICD-10-CM

## 2020-06-18 DIAGNOSIS — E782 Mixed hyperlipidemia: Secondary | ICD-10-CM | POA: Diagnosis not present

## 2020-06-18 DIAGNOSIS — R1319 Other dysphagia: Secondary | ICD-10-CM

## 2020-06-18 DIAGNOSIS — I6522 Occlusion and stenosis of left carotid artery: Secondary | ICD-10-CM

## 2020-06-18 DIAGNOSIS — E119 Type 2 diabetes mellitus without complications: Secondary | ICD-10-CM

## 2020-06-18 DIAGNOSIS — I25118 Atherosclerotic heart disease of native coronary artery with other forms of angina pectoris: Secondary | ICD-10-CM

## 2020-06-18 MED ORDER — EMPAGLIFLOZIN 10 MG PO TABS: 10.0000 mg | ORAL_TABLET | Freq: Every day | ORAL | 2 refills | Status: DC

## 2020-06-25 ENCOUNTER — Other Ambulatory Visit: Payer: Self-pay | Admitting: Gastroenterology

## 2020-06-25 DIAGNOSIS — R1310 Dysphagia, unspecified: Secondary | ICD-10-CM | POA: Diagnosis not present

## 2020-06-25 DIAGNOSIS — R1110 Vomiting, unspecified: Secondary | ICD-10-CM | POA: Diagnosis not present

## 2020-06-25 DIAGNOSIS — K219 Gastro-esophageal reflux disease without esophagitis: Secondary | ICD-10-CM | POA: Diagnosis not present

## 2020-06-28 ENCOUNTER — Other Ambulatory Visit: Payer: PPO

## 2020-06-28 NOTE — Progress Notes (Signed)
Attempted to obtain medical history via telephone, unable to reach at this time. I left a voicemail to return pre surgical testing department's phone call.  

## 2020-07-03 ENCOUNTER — Other Ambulatory Visit (HOSPITAL_COMMUNITY)
Admission: RE | Admit: 2020-07-03 | Discharge: 2020-07-03 | Disposition: A | Discharge status: Home / Self Care | Payer: PPO | Source: Ambulatory Visit | Attending: Gastroenterology | Admitting: Gastroenterology

## 2020-07-03 DIAGNOSIS — Z20822 Contact with and (suspected) exposure to covid-19: Secondary | ICD-10-CM | POA: Diagnosis not present

## 2020-07-03 DIAGNOSIS — Z01812 Encounter for preprocedural laboratory examination: Secondary | ICD-10-CM | POA: Insufficient documentation

## 2020-07-03 LAB — SARS CORONAVIRUS 2 (TAT 6-24 HRS): SARS Coronavirus 2: NEGATIVE

## 2020-07-06 ENCOUNTER — Ambulatory Visit (HOSPITAL_COMMUNITY)
Admission: RE | Admit: 2020-07-06 | Discharge: 2020-07-06 | Disposition: A | Discharge status: Home / Self Care | Payer: PPO | Attending: Gastroenterology | Admitting: Gastroenterology

## 2020-07-06 ENCOUNTER — Encounter (HOSPITAL_COMMUNITY): Payer: Self-pay | Admitting: Gastroenterology

## 2020-07-06 ENCOUNTER — Ambulatory Visit (HOSPITAL_COMMUNITY): Payer: PPO | Admitting: Anesthesiology

## 2020-07-06 ENCOUNTER — Other Ambulatory Visit: Payer: Self-pay

## 2020-07-06 ENCOUNTER — Encounter (HOSPITAL_COMMUNITY)
Admission: RE | Disposition: A | Discharge status: Home / Self Care | Payer: Self-pay | Source: Home / Self Care | Attending: Gastroenterology

## 2020-07-06 DIAGNOSIS — K440 Diaphragmatic hernia with obstruction, without gangrene: Secondary | ICD-10-CM | POA: Diagnosis not present

## 2020-07-06 DIAGNOSIS — Z951 Presence of aortocoronary bypass graft: Secondary | ICD-10-CM | POA: Insufficient documentation

## 2020-07-06 DIAGNOSIS — R1110 Vomiting, unspecified: Secondary | ICD-10-CM | POA: Diagnosis not present

## 2020-07-06 DIAGNOSIS — R1310 Dysphagia, unspecified: Secondary | ICD-10-CM | POA: Diagnosis not present

## 2020-07-06 DIAGNOSIS — Z87891 Personal history of nicotine dependence: Secondary | ICD-10-CM | POA: Insufficient documentation

## 2020-07-06 DIAGNOSIS — L8990 Pressure ulcer of unspecified site, unspecified stage: Secondary | ICD-10-CM | POA: Diagnosis not present

## 2020-07-06 DIAGNOSIS — M1990 Unspecified osteoarthritis, unspecified site: Secondary | ICD-10-CM | POA: Diagnosis not present

## 2020-07-06 DIAGNOSIS — K449 Diaphragmatic hernia without obstruction or gangrene: Secondary | ICD-10-CM | POA: Diagnosis not present

## 2020-07-06 DIAGNOSIS — K219 Gastro-esophageal reflux disease without esophagitis: Secondary | ICD-10-CM | POA: Diagnosis not present

## 2020-07-06 DIAGNOSIS — K2100 Gastro-esophageal reflux disease with esophagitis, without bleeding: Secondary | ICD-10-CM | POA: Diagnosis not present

## 2020-07-06 DIAGNOSIS — E785 Hyperlipidemia, unspecified: Secondary | ICD-10-CM | POA: Diagnosis not present

## 2020-07-06 DIAGNOSIS — K222 Esophageal obstruction: Secondary | ICD-10-CM | POA: Insufficient documentation

## 2020-07-06 HISTORY — PX: ESOPHAGOGASTRODUODENOSCOPY (EGD) WITH PROPOFOL: SHX5813

## 2020-07-06 HISTORY — PX: SAVORY DILATION: SHX5439

## 2020-07-06 LAB — GLUCOSE, CAPILLARY: Glucose-Capillary: 130 mg/dL — ABNORMAL HIGH (ref 70–99)

## 2020-07-06 SURGERY — ESOPHAGOGASTRODUODENOSCOPY (EGD) WITH PROPOFOL
Anesthesia: Monitor Anesthesia Care

## 2020-07-06 MED ORDER — PROPOFOL 500 MG/50ML IV EMUL
INTRAVENOUS | Status: DC | PRN
  Administered 2020-07-06: 125 ug/kg/min via INTRAVENOUS

## 2020-07-06 MED ORDER — LACTATED RINGERS IV SOLN
INTRAVENOUS | Status: AC | PRN
  Administered 2020-07-06: 10 mL/h via INTRAVENOUS

## 2020-07-06 MED ORDER — LIDOCAINE HCL (CARDIAC) PF 100 MG/5ML IV SOSY
PREFILLED_SYRINGE | INTRAVENOUS | Status: DC | PRN
  Administered 2020-07-06: 100 mg via INTRAVENOUS

## 2020-07-06 MED ORDER — PROPOFOL 10 MG/ML IV BOLUS
INTRAVENOUS | Status: DC | PRN
  Administered 2020-07-06: 40 mg via INTRAVENOUS

## 2020-07-06 MED ORDER — SODIUM CHLORIDE 0.9 % IV SOLN: INTRAVENOUS | Status: DC

## 2020-07-06 SURGICAL SUPPLY — 14 items

## 2020-07-06 NOTE — Transfer of Care (Signed)
Immediate Anesthesia Transfer of Care Note  Patient: Sean Bridges  Procedure(s) Performed: ESOPHAGOGASTRODUODENOSCOPY (EGD) WITH PROPOFOL (N/A ) SAVORY DILATION (N/A )  Patient Location: PACU and Endoscopy Unit  Anesthesia Type:MAC  Level of Consciousness: awake and drowsy  Airway & Oxygen Therapy: Patient Spontanous Breathing and Patient connected to face mask oxygen  Post-op Assessment: Report given to RN and Post -op Vital signs reviewed and stable  Post vital signs: Reviewed and stable  Last Vitals:  Vitals Value Taken Time  BP 89/53 07/06/20 1150  Temp    Pulse 52 07/06/20 1152  Resp 14 07/06/20 1152  SpO2 96 % 07/06/20 1152  Vitals shown include unvalidated device data.  Last Pain:  Vitals:   07/06/20 1145  TempSrc:   PainSc: 0-No pain         Complications: No complications documented.

## 2020-07-06 NOTE — Anesthesia Postprocedure Evaluation (Signed)
Anesthesia Post Note  Patient: Sean Bridges  Procedure(s) Performed: ESOPHAGOGASTRODUODENOSCOPY (EGD) WITH PROPOFOL (N/A ) SAVORY DILATION (N/A )     Patient location during evaluation: Endoscopy Anesthesia Type: MAC Level of consciousness: awake and alert Pain management: pain level controlled Vital Signs Assessment: post-procedure vital signs reviewed and stable Respiratory status: spontaneous breathing, nonlabored ventilation and respiratory function stable Cardiovascular status: blood pressure returned to baseline and stable Postop Assessment: no apparent nausea or vomiting Anesthetic complications: no   No complications documented.  Last Vitals:  Vitals:   07/06/20 1200 07/06/20 1210  BP: (!) 93/52 112/63  Pulse: (!) 55 (!) 53  Resp: 16 13  Temp:    SpO2: 94% 95%    Last Pain:  Vitals:   07/06/20 1210  TempSrc:   PainSc: 0-No pain                 Merlinda Frederick

## 2020-07-06 NOTE — Discharge Instructions (Signed)

## 2020-07-06 NOTE — Op Note (Signed)
[facility] Patient Name: Sean Bridges Procedure Date: 07/06/2020 MRN: [id number] Attending MD: Carol Ada , MD Date of Birth: 1945/11/13 CSN: [id number] Age: 75 Admit Type: Outpatient Procedure:                Upper GI endoscopy Indications:              Dysphagia Providers:                Carol Ada, MD, Cleda Daub, RN, Lesia Sago, Technician, Stephanie [place] CRNA Referring MD:              Medicines:                Propofol per Anesthesia Complications:            No immediate complications. Estimated Blood Loss:     Estimated blood loss was minimal. Procedure:                Pre-Anesthesia Assessment:                           - Prior to the procedure, a History and Physical                            was performed, and patient medications and                            allergies were reviewed. The patient's tolerance of                            previous anesthesia was also reviewed. The risks                            and benefits of the procedure and the sedation                            options and risks were discussed with the patient.                            All questions were answered, and informed consent                            was obtained. Prior Anticoagulants: The patient has                            taken no previous anticoagulant or antiplatelet                            agents. ASA Grade Assessment: III - A patient with                            severe systemic disease. After reviewing the risks  and benefits, the patient was deemed in                            satisfactory condition to undergo the procedure.                           - Sedation was administered by an anesthesia                            professional. Deep sedation was attained.                           After obtaining informed consent, the endoscope was                            passed under  direct vision. Throughout the                            procedure, the patient's blood pressure, pulse, and                            oxygen saturations were monitored continuously. The                            [id number]-H190 ([id number] Olympus gastroscope was                            introduced through the mouth, and advanced to the                            second part of duodenum. The upper GI endoscopy was                            accomplished without difficulty. The patient                            tolerated the procedure well. Scope In: Scope Out: Findings:      One benign-appearing, intrinsic mild stenosis was found at the       gastroesophageal junction. This stenosis measured 1.6 cm (inner       diameter) x less than one cm (in length). The stenosis was traversed. A       guidewire was placed and the scope was withdrawn. Dilation was performed       with a Savary dilator with no resistance at 18 mm. The dilation site was       examined following endoscope reinsertion and showed complete resolution       of luminal narrowing. Estimated blood loss was minimal.      LA Grade A (one or more mucosal breaks less than 5 mm, not extending       between tops of 2 mucosal folds) esophagitis with no bleeding was found       at the gastroesophageal junction.      A 3 cm hiatal hernia was present.      The stomach was normal.      The examined duodenum was normal.  Impression:               - Benign-appearing esophageal stenosis. Dilated.                           - LA Grade A reflux esophagitis with no bleeding.                           - 3 cm hiatal hernia.                           - Normal stomach.                           - Normal examined duodenum.                           - No specimens collected. Moderate Sedation:      Not Applicable - Patient had care per Anesthesia. Recommendation:           - Patient has a contact number available for                             emergencies. The signs and symptoms of potential                            delayed complications were discussed with the                            patient. Return to normal activities tomorrow.                            Written discharge instructions were provided to the                            patient.                           - Resume previous diet.                           - Continue present medications.                           - Return to GI clinic in 4 weeks.                           - Omeprazole 40 mg QD. Procedure Code(s):        --- Professional ---                           [phone], Esophagogastroduodenoscopy, flexible,                            transoral; with insertion of guide wire followed by  passage of dilator(s) through esophagus over guide                            wire Diagnosis Code(s):        --- Professional ---                           K22.2, Esophageal obstruction                           K21.00, Gastro-esophageal reflux disease with                            esophagitis, without bleeding                           K44.9, Diaphragmatic hernia without obstruction or                            gangrene                           R13.10, Dysphagia, unspecified CPT copyright 2019 American Medical Association. All rights reserved. The codes documented in this report are preliminary and upon coder review may  be revised to meet current compliance requirements. Carol Ada, MD Carol Ada, MD 07/06/2020 11:45:43 AM This report has been signed electronically. Number of Addenda: 0
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
## 2020-07-09 ENCOUNTER — Encounter (HOSPITAL_COMMUNITY): Payer: Self-pay | Admitting: Gastroenterology

## 2020-07-13 ENCOUNTER — Ambulatory Visit: Payer: PPO

## 2020-07-13 ENCOUNTER — Other Ambulatory Visit: Payer: Self-pay

## 2020-07-13 DIAGNOSIS — I6522 Occlusion and stenosis of left carotid artery: Secondary | ICD-10-CM | POA: Diagnosis not present

## 2020-07-15 NOTE — Progress Notes (Signed)
Left carotid chronic occlusion,  no change from 2018. Will check in 1 year

## 2020-07-23 DIAGNOSIS — I2510 Atherosclerotic heart disease of native coronary artery without angina pectoris: Secondary | ICD-10-CM | POA: Diagnosis not present

## 2020-07-23 DIAGNOSIS — E1169 Type 2 diabetes mellitus with other specified complication: Secondary | ICD-10-CM | POA: Diagnosis not present

## 2020-07-23 DIAGNOSIS — E782 Mixed hyperlipidemia: Secondary | ICD-10-CM | POA: Diagnosis not present

## 2020-07-26 DIAGNOSIS — E1169 Type 2 diabetes mellitus with other specified complication: Secondary | ICD-10-CM | POA: Diagnosis not present

## 2020-07-26 DIAGNOSIS — J432 Centrilobular emphysema: Secondary | ICD-10-CM | POA: Diagnosis not present

## 2020-07-26 DIAGNOSIS — I2510 Atherosclerotic heart disease of native coronary artery without angina pectoris: Secondary | ICD-10-CM | POA: Diagnosis not present

## 2020-07-26 DIAGNOSIS — E782 Mixed hyperlipidemia: Secondary | ICD-10-CM | POA: Diagnosis not present

## 2020-08-06 DIAGNOSIS — K219 Gastro-esophageal reflux disease without esophagitis: Secondary | ICD-10-CM | POA: Diagnosis not present

## 2020-08-06 DIAGNOSIS — K222 Esophageal obstruction: Secondary | ICD-10-CM | POA: Diagnosis not present

## 2020-08-06 DIAGNOSIS — K449 Diaphragmatic hernia without obstruction or gangrene: Secondary | ICD-10-CM | POA: Diagnosis not present

## 2020-10-08 DIAGNOSIS — H8110 Benign paroxysmal vertigo, unspecified ear: Secondary | ICD-10-CM | POA: Diagnosis not present

## 2020-11-01 DIAGNOSIS — R7303 Prediabetes: Secondary | ICD-10-CM | POA: Diagnosis not present

## 2020-11-01 DIAGNOSIS — E782 Mixed hyperlipidemia: Secondary | ICD-10-CM | POA: Diagnosis not present

## 2020-11-07 DIAGNOSIS — M65332 Trigger finger, left middle finger: Secondary | ICD-10-CM | POA: Diagnosis not present

## 2020-11-07 DIAGNOSIS — M65322 Trigger finger, left index finger: Secondary | ICD-10-CM | POA: Diagnosis not present

## 2020-11-07 DIAGNOSIS — M79645 Pain in left finger(s): Secondary | ICD-10-CM | POA: Diagnosis not present

## 2020-12-28 DIAGNOSIS — I2510 Atherosclerotic heart disease of native coronary artery without angina pectoris: Secondary | ICD-10-CM | POA: Diagnosis not present

## 2020-12-28 DIAGNOSIS — E782 Mixed hyperlipidemia: Secondary | ICD-10-CM | POA: Diagnosis not present

## 2020-12-28 DIAGNOSIS — J432 Centrilobular emphysema: Secondary | ICD-10-CM | POA: Diagnosis not present

## 2020-12-28 DIAGNOSIS — E1165 Type 2 diabetes mellitus with hyperglycemia: Secondary | ICD-10-CM | POA: Diagnosis not present

## 2020-12-28 DIAGNOSIS — T148XXA Other injury of unspecified body region, initial encounter: Secondary | ICD-10-CM | POA: Diagnosis not present

## 2020-12-31 ENCOUNTER — Other Ambulatory Visit: Payer: Self-pay

## 2020-12-31 ENCOUNTER — Emergency Department (HOSPITAL_COMMUNITY)
Admission: EM | Admit: 2020-12-31 | Discharge: 2020-12-31 | Disposition: A | Discharge status: Home / Self Care | Payer: PPO | Attending: Emergency Medicine | Admitting: Emergency Medicine

## 2020-12-31 ENCOUNTER — Encounter (HOSPITAL_COMMUNITY): Payer: Self-pay | Admitting: Emergency Medicine

## 2020-12-31 DIAGNOSIS — I119 Hypertensive heart disease without heart failure: Secondary | ICD-10-CM | POA: Diagnosis not present

## 2020-12-31 DIAGNOSIS — Z951 Presence of aortocoronary bypass graft: Secondary | ICD-10-CM | POA: Insufficient documentation

## 2020-12-31 DIAGNOSIS — Z87891 Personal history of nicotine dependence: Secondary | ICD-10-CM | POA: Insufficient documentation

## 2020-12-31 DIAGNOSIS — Z7982 Long term (current) use of aspirin: Secondary | ICD-10-CM | POA: Diagnosis not present

## 2020-12-31 DIAGNOSIS — L03115 Cellulitis of right lower limb: Secondary | ICD-10-CM | POA: Diagnosis not present

## 2020-12-31 DIAGNOSIS — Z79899 Other long term (current) drug therapy: Secondary | ICD-10-CM | POA: Insufficient documentation

## 2020-12-31 DIAGNOSIS — J449 Chronic obstructive pulmonary disease, unspecified: Secondary | ICD-10-CM | POA: Insufficient documentation

## 2020-12-31 DIAGNOSIS — I2510 Atherosclerotic heart disease of native coronary artery without angina pectoris: Secondary | ICD-10-CM | POA: Diagnosis not present

## 2020-12-31 DIAGNOSIS — R2241 Localized swelling, mass and lump, right lower limb: Secondary | ICD-10-CM | POA: Diagnosis present

## 2020-12-31 LAB — URINALYSIS, MICROSCOPIC (REFLEX)
Bacteria, UA: NONE SEEN
Squamous Epithelial / HPF: NONE SEEN (ref 0–5)

## 2020-12-31 LAB — URINALYSIS, ROUTINE W REFLEX MICROSCOPIC
Glucose, UA: NEGATIVE mg/dL
Leukocytes,Ua: NEGATIVE
Nitrite: NEGATIVE
Protein, ur: NEGATIVE mg/dL
Specific Gravity, Urine: 1.025 (ref 1.005–1.030)
pH: 6 (ref 5.0–8.0)

## 2020-12-31 LAB — CBC WITH DIFFERENTIAL/PLATELET
Abs Immature Granulocytes: 0.06 10*3/uL (ref 0.00–0.07)
Basophils Absolute: 0.1 10*3/uL (ref 0.0–0.1)
Basophils Relative: 0 %
Eosinophils Absolute: 0 10*3/uL (ref 0.0–0.5)
Eosinophils Relative: 0 %
HCT: 47.4 % (ref 39.0–52.0)
Hemoglobin: 16 g/dL (ref 13.0–17.0)
Immature Granulocytes: 1 %
Lymphocytes Relative: 8 %
Lymphs Abs: 1 10*3/uL (ref 0.7–4.0)
MCH: 30.3 pg (ref 26.0–34.0)
MCHC: 33.8 g/dL (ref 30.0–36.0)
MCV: 89.8 fL (ref 80.0–100.0)
Monocytes Absolute: 0.6 10*3/uL (ref 0.1–1.0)
Monocytes Relative: 5 %
Neutro Abs: 10.6 10*3/uL — ABNORMAL HIGH (ref 1.7–7.7)
Neutrophils Relative %: 86 %
Platelets: 180 10*3/uL (ref 150–400)
RBC: 5.28 MIL/uL (ref 4.22–5.81)
RDW: 13.1 % (ref 11.5–15.5)
WBC: 12.4 10*3/uL — ABNORMAL HIGH (ref 4.0–10.5)
nRBC: 0 % (ref 0.0–0.2)

## 2020-12-31 LAB — COMPREHENSIVE METABOLIC PANEL
ALT: 20 U/L (ref 0–44)
AST: 21 U/L (ref 15–41)
Albumin: 4.2 g/dL (ref 3.5–5.0)
Alkaline Phosphatase: 77 U/L (ref 38–126)
Anion gap: 10 (ref 5–15)
BUN: 21 mg/dL (ref 8–23)
CO2: 26 mmol/L (ref 22–32)
Calcium: 9.5 mg/dL (ref 8.9–10.3)
Chloride: 98 mmol/L (ref 98–111)
Creatinine, Ser: 1.51 mg/dL — ABNORMAL HIGH (ref 0.61–1.24)
GFR, Estimated: 48 mL/min — ABNORMAL LOW (ref >60)
Glucose, Bld: 131 mg/dL — ABNORMAL HIGH (ref 70–99)
Potassium: 3.7 mmol/L (ref 3.5–5.1)
Sodium: 134 mmol/L — ABNORMAL LOW (ref 135–145)
Total Bilirubin: 1.5 mg/dL — ABNORMAL HIGH (ref 0.3–1.2)
Total Protein: 7.9 g/dL (ref 6.5–8.1)

## 2020-12-31 LAB — LACTIC ACID, PLASMA
Lactic Acid, Venous: 1.4 mmol/L (ref 0.5–1.9)
Lactic Acid, Venous: 1.4 mmol/L (ref 0.5–1.9)

## 2020-12-31 MED ORDER — ACETAMINOPHEN 325 MG PO TABS
650.0000 mg | ORAL_TABLET | Freq: Once | ORAL | Status: AC
  Administered 2020-12-31: 650 mg via ORAL
  Filled 2020-12-31: qty 2

## 2020-12-31 MED ORDER — SODIUM CHLORIDE 0.9 % IV SOLN
3.0000 g | Freq: Once | INTRAVENOUS | Status: AC
  Administered 2020-12-31: 3 g via INTRAVENOUS
  Filled 2020-12-31: qty 8

## 2020-12-31 MED ORDER — AMOXICILLIN-POT CLAVULANATE 875-125 MG PO TABS: 1.0000 | ORAL_TABLET | Freq: Two times a day (BID) | ORAL | 0 refills | Status: DC

## 2020-12-31 NOTE — Discharge Instructions (Addendum)
Call your primary care doctor or specialist as discussed in the next 2-3 days.   Return immediately back to the ER if:  Your symptoms worsen within the next 12-24 hours. You develop new symptoms such as new fevers, persistent vomiting, new pain, shortness of breath, or new weakness or numbness, or if you have any other concerns.  
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
## 2020-12-31 NOTE — ED Triage Notes (Signed)
Patient reports redness/streaking of the right leg since this morning. He reports having a similar issue previously and was admitted for an infection of the leg x1 week. He reports a fever of 101.0 yesterday

## 2021-01-05 LAB — CULTURE, BLOOD (ROUTINE X 2)
Culture: NO GROWTH
Culture: NO GROWTH
Special Requests: ADEQUATE
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
## 2021-01-08 ENCOUNTER — Other Ambulatory Visit: Payer: Self-pay | Admitting: Cardiology

## 2021-01-08 DIAGNOSIS — I209 Angina pectoris, unspecified: Secondary | ICD-10-CM

## 2021-01-08 DIAGNOSIS — I25118 Atherosclerotic heart disease of native coronary artery with other forms of angina pectoris: Secondary | ICD-10-CM

## 2021-05-06 ENCOUNTER — Other Ambulatory Visit: Payer: Self-pay | Admitting: Cardiology

## 2021-05-06 DIAGNOSIS — I209 Angina pectoris, unspecified: Secondary | ICD-10-CM

## 2021-05-06 DIAGNOSIS — I25118 Atherosclerotic heart disease of native coronary artery with other forms of angina pectoris: Secondary | ICD-10-CM

## 2021-05-10 DIAGNOSIS — J432 Centrilobular emphysema: Secondary | ICD-10-CM | POA: Diagnosis not present

## 2021-05-10 DIAGNOSIS — E782 Mixed hyperlipidemia: Secondary | ICD-10-CM | POA: Diagnosis not present

## 2021-05-10 DIAGNOSIS — E1165 Type 2 diabetes mellitus with hyperglycemia: Secondary | ICD-10-CM | POA: Diagnosis not present

## 2021-05-10 DIAGNOSIS — I2510 Atherosclerotic heart disease of native coronary artery without angina pectoris: Secondary | ICD-10-CM | POA: Diagnosis not present

## 2021-05-10 DIAGNOSIS — R5383 Other fatigue: Secondary | ICD-10-CM | POA: Diagnosis not present

## 2021-05-17 DIAGNOSIS — E782 Mixed hyperlipidemia: Secondary | ICD-10-CM | POA: Diagnosis not present

## 2021-05-17 DIAGNOSIS — E1165 Type 2 diabetes mellitus with hyperglycemia: Secondary | ICD-10-CM | POA: Diagnosis not present

## 2021-05-17 DIAGNOSIS — M150 Primary generalized (osteo)arthritis: Secondary | ICD-10-CM | POA: Diagnosis not present

## 2021-05-17 DIAGNOSIS — N182 Chronic kidney disease, stage 2 (mild): Secondary | ICD-10-CM | POA: Diagnosis not present

## 2021-05-17 DIAGNOSIS — J432 Centrilobular emphysema: Secondary | ICD-10-CM | POA: Diagnosis not present

## 2021-05-17 DIAGNOSIS — Z0000 Encounter for general adult medical examination without abnormal findings: Secondary | ICD-10-CM | POA: Diagnosis not present

## 2021-05-17 DIAGNOSIS — R072 Precordial pain: Secondary | ICD-10-CM | POA: Diagnosis not present

## 2021-05-17 DIAGNOSIS — I2510 Atherosclerotic heart disease of native coronary artery without angina pectoris: Secondary | ICD-10-CM | POA: Diagnosis not present

## 2021-06-17 ENCOUNTER — Ambulatory Visit: Payer: PPO | Admitting: Cardiology

## 2021-06-17 ENCOUNTER — Encounter: Payer: Self-pay | Admitting: Cardiology

## 2021-06-17 ENCOUNTER — Other Ambulatory Visit: Payer: Self-pay

## 2021-06-17 VITALS — BP 133/70 | HR 60 | Temp 97.6°F | Resp 16 | Ht 68.0 in | Wt 181.6 lb

## 2021-06-17 DIAGNOSIS — I10 Essential (primary) hypertension: Secondary | ICD-10-CM

## 2021-06-17 DIAGNOSIS — I6522 Occlusion and stenosis of left carotid artery: Secondary | ICD-10-CM | POA: Diagnosis not present

## 2021-06-17 DIAGNOSIS — I25118 Atherosclerotic heart disease of native coronary artery with other forms of angina pectoris: Secondary | ICD-10-CM

## 2021-06-17 DIAGNOSIS — Z951 Presence of aortocoronary bypass graft: Secondary | ICD-10-CM | POA: Diagnosis not present
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
## 2021-06-18 ENCOUNTER — Ambulatory Visit: Payer: PPO

## 2021-06-18 DIAGNOSIS — I6522 Occlusion and stenosis of left carotid artery: Secondary | ICD-10-CM

## 2021-07-31 DIAGNOSIS — L989 Disorder of the skin and subcutaneous tissue, unspecified: Secondary | ICD-10-CM | POA: Diagnosis not present

## 2021-07-31 DIAGNOSIS — D485 Neoplasm of uncertain behavior of skin: Secondary | ICD-10-CM | POA: Diagnosis not present

## 2021-07-31 DIAGNOSIS — L570 Actinic keratosis: Secondary | ICD-10-CM | POA: Diagnosis not present

## 2021-07-31 DIAGNOSIS — L723 Sebaceous cyst: Secondary | ICD-10-CM | POA: Diagnosis not present

## 2021-09-19 DIAGNOSIS — J432 Centrilobular emphysema: Secondary | ICD-10-CM | POA: Diagnosis not present

## 2021-09-19 DIAGNOSIS — E1165 Type 2 diabetes mellitus with hyperglycemia: Secondary | ICD-10-CM | POA: Diagnosis not present

## 2021-09-19 DIAGNOSIS — N182 Chronic kidney disease, stage 2 (mild): Secondary | ICD-10-CM | POA: Diagnosis not present

## 2021-09-19 DIAGNOSIS — E782 Mixed hyperlipidemia: Secondary | ICD-10-CM | POA: Diagnosis not present

## 2021-09-19 DIAGNOSIS — M150 Primary generalized (osteo)arthritis: Secondary | ICD-10-CM | POA: Diagnosis not present

## 2021-09-19 DIAGNOSIS — I2510 Atherosclerotic heart disease of native coronary artery without angina pectoris: Secondary | ICD-10-CM | POA: Diagnosis not present

## 2021-09-27 DIAGNOSIS — E1169 Type 2 diabetes mellitus with other specified complication: Secondary | ICD-10-CM | POA: Diagnosis not present

## 2021-09-27 DIAGNOSIS — N182 Chronic kidney disease, stage 2 (mild): Secondary | ICD-10-CM | POA: Diagnosis not present

## 2021-09-27 DIAGNOSIS — I2510 Atherosclerotic heart disease of native coronary artery without angina pectoris: Secondary | ICD-10-CM | POA: Diagnosis not present

## 2021-09-27 DIAGNOSIS — Z23 Encounter for immunization: Secondary | ICD-10-CM | POA: Diagnosis not present

## 2021-09-27 DIAGNOSIS — J432 Centrilobular emphysema: Secondary | ICD-10-CM | POA: Diagnosis not present

## 2021-09-27 DIAGNOSIS — E782 Mixed hyperlipidemia: Secondary | ICD-10-CM | POA: Diagnosis not present

## 2021-09-27 DIAGNOSIS — M150 Primary generalized (osteo)arthritis: Secondary | ICD-10-CM | POA: Diagnosis not present

## 2021-12-02 DIAGNOSIS — L570 Actinic keratosis: Secondary | ICD-10-CM | POA: Diagnosis not present

## 2021-12-02 DIAGNOSIS — L814 Other melanin hyperpigmentation: Secondary | ICD-10-CM | POA: Diagnosis not present

## 2021-12-02 DIAGNOSIS — L821 Other seborrheic keratosis: Secondary | ICD-10-CM | POA: Diagnosis not present

## 2021-12-25 ENCOUNTER — Other Ambulatory Visit: Payer: Self-pay | Admitting: Cardiology

## 2021-12-25 DIAGNOSIS — I25118 Atherosclerotic heart disease of native coronary artery with other forms of angina pectoris: Secondary | ICD-10-CM

## 2021-12-25 DIAGNOSIS — I209 Angina pectoris, unspecified: Secondary | ICD-10-CM

## 2021-12-30 DIAGNOSIS — M65342 Trigger finger, left ring finger: Secondary | ICD-10-CM | POA: Diagnosis not present

## 2021-12-30 DIAGNOSIS — M79645 Pain in left finger(s): Secondary | ICD-10-CM | POA: Diagnosis not present

## 2021-12-30 DIAGNOSIS — M65322 Trigger finger, left index finger: Secondary | ICD-10-CM | POA: Diagnosis not present

## 2021-12-30 DIAGNOSIS — M65332 Trigger finger, left middle finger: Secondary | ICD-10-CM | POA: Diagnosis not present

## 2022-05-23 DIAGNOSIS — R5383 Other fatigue: Secondary | ICD-10-CM | POA: Diagnosis not present

## 2022-05-23 DIAGNOSIS — E782 Mixed hyperlipidemia: Secondary | ICD-10-CM | POA: Diagnosis not present

## 2022-05-23 DIAGNOSIS — N182 Chronic kidney disease, stage 2 (mild): Secondary | ICD-10-CM | POA: Diagnosis not present

## 2022-05-23 DIAGNOSIS — Z0000 Encounter for general adult medical examination without abnormal findings: Secondary | ICD-10-CM | POA: Diagnosis not present

## 2022-05-23 DIAGNOSIS — E1169 Type 2 diabetes mellitus with other specified complication: Secondary | ICD-10-CM | POA: Diagnosis not present

## 2022-05-30 DIAGNOSIS — E782 Mixed hyperlipidemia: Secondary | ICD-10-CM | POA: Diagnosis not present

## 2022-05-30 DIAGNOSIS — N182 Chronic kidney disease, stage 2 (mild): Secondary | ICD-10-CM | POA: Diagnosis not present

## 2022-05-30 DIAGNOSIS — I2510 Atherosclerotic heart disease of native coronary artery without angina pectoris: Secondary | ICD-10-CM | POA: Diagnosis not present

## 2022-05-30 DIAGNOSIS — I10 Essential (primary) hypertension: Secondary | ICD-10-CM | POA: Diagnosis not present

## 2022-05-30 DIAGNOSIS — M150 Primary generalized (osteo)arthritis: Secondary | ICD-10-CM | POA: Diagnosis not present

## 2022-05-30 DIAGNOSIS — R079 Chest pain, unspecified: Secondary | ICD-10-CM | POA: Diagnosis not present

## 2022-05-30 DIAGNOSIS — E1165 Type 2 diabetes mellitus with hyperglycemia: Secondary | ICD-10-CM | POA: Diagnosis not present

## 2022-05-30 DIAGNOSIS — J432 Centrilobular emphysema: Secondary | ICD-10-CM | POA: Diagnosis not present

## 2022-05-30 DIAGNOSIS — Z0000 Encounter for general adult medical examination without abnormal findings: Secondary | ICD-10-CM | POA: Diagnosis not present

## 2022-06-11 ENCOUNTER — Ambulatory Visit: Payer: PPO

## 2022-06-11 DIAGNOSIS — I6522 Occlusion and stenosis of left carotid artery: Secondary | ICD-10-CM | POA: Diagnosis not present

## 2022-06-18 ENCOUNTER — Ambulatory Visit: Payer: PPO | Admitting: Cardiology

## 2022-06-25 ENCOUNTER — Ambulatory Visit: Payer: PPO | Admitting: Cardiology

## 2022-06-30 ENCOUNTER — Encounter: Payer: Self-pay | Admitting: Cardiology

## 2022-06-30 ENCOUNTER — Ambulatory Visit: Payer: PPO | Admitting: Cardiology

## 2022-06-30 VITALS — BP 113/62 | HR 74 | Ht 68.0 in | Wt 184.0 lb

## 2022-06-30 DIAGNOSIS — Z951 Presence of aortocoronary bypass graft: Secondary | ICD-10-CM

## 2022-06-30 DIAGNOSIS — I25118 Atherosclerotic heart disease of native coronary artery with other forms of angina pectoris: Secondary | ICD-10-CM

## 2022-06-30 DIAGNOSIS — I10 Essential (primary) hypertension: Secondary | ICD-10-CM | POA: Diagnosis not present

## 2022-06-30 DIAGNOSIS — I6522 Occlusion and stenosis of left carotid artery: Secondary | ICD-10-CM
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
## 2022-07-30 ENCOUNTER — Other Ambulatory Visit: Payer: Self-pay | Admitting: Cardiology

## 2022-07-30 DIAGNOSIS — I25118 Atherosclerotic heart disease of native coronary artery with other forms of angina pectoris: Secondary | ICD-10-CM

## 2022-07-30 DIAGNOSIS — I209 Angina pectoris, unspecified: Secondary | ICD-10-CM

## 2022-07-31 ENCOUNTER — Encounter: Payer: Self-pay | Admitting: Internal Medicine

## 2022-07-31 ENCOUNTER — Ambulatory Visit: Payer: PPO | Admitting: Internal Medicine

## 2022-07-31 VITALS — BP 144/74 | HR 56 | Ht 68.0 in | Wt 181.0 lb

## 2022-07-31 DIAGNOSIS — Z951 Presence of aortocoronary bypass graft: Secondary | ICD-10-CM | POA: Diagnosis not present

## 2022-07-31 DIAGNOSIS — I25110 Atherosclerotic heart disease of native coronary artery with unstable angina pectoris: Secondary | ICD-10-CM

## 2022-07-31 DIAGNOSIS — R079 Chest pain, unspecified: Secondary | ICD-10-CM

## 2022-07-31 DIAGNOSIS — I200 Unstable angina: Secondary | ICD-10-CM
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
## 2022-08-03 ENCOUNTER — Other Ambulatory Visit: Payer: Self-pay

## 2022-08-03 ENCOUNTER — Inpatient Hospital Stay (HOSPITAL_COMMUNITY)
Admission: EM | Admit: 2022-08-03 | Discharge: 2022-08-04 | DRG: 287 | Disposition: A | Discharge status: Home / Self Care | Payer: PPO | Attending: Internal Medicine | Admitting: Internal Medicine

## 2022-08-03 ENCOUNTER — Emergency Department (HOSPITAL_COMMUNITY): Payer: PPO

## 2022-08-03 DIAGNOSIS — I440 Atrioventricular block, first degree: Secondary | ICD-10-CM | POA: Diagnosis present

## 2022-08-03 DIAGNOSIS — Z87891 Personal history of nicotine dependence: Secondary | ICD-10-CM

## 2022-08-03 DIAGNOSIS — I2510 Atherosclerotic heart disease of native coronary artery without angina pectoris: Secondary | ICD-10-CM | POA: Diagnosis not present

## 2022-08-03 DIAGNOSIS — Z951 Presence of aortocoronary bypass graft: Secondary | ICD-10-CM | POA: Diagnosis not present

## 2022-08-03 DIAGNOSIS — E1165 Type 2 diabetes mellitus with hyperglycemia: Secondary | ICD-10-CM | POA: Diagnosis not present

## 2022-08-03 DIAGNOSIS — I25710 Atherosclerosis of autologous vein coronary artery bypass graft(s) with unstable angina pectoris: Secondary | ICD-10-CM | POA: Diagnosis not present

## 2022-08-03 DIAGNOSIS — Z7984 Long term (current) use of oral hypoglycemic drugs: Secondary | ICD-10-CM | POA: Diagnosis not present

## 2022-08-03 DIAGNOSIS — R0789 Other chest pain: Secondary | ICD-10-CM | POA: Diagnosis not present

## 2022-08-03 DIAGNOSIS — I252 Old myocardial infarction: Secondary | ICD-10-CM | POA: Diagnosis not present

## 2022-08-03 DIAGNOSIS — E785 Hyperlipidemia, unspecified: Secondary | ICD-10-CM | POA: Diagnosis not present

## 2022-08-03 DIAGNOSIS — Z79899 Other long term (current) drug therapy: Secondary | ICD-10-CM | POA: Diagnosis not present

## 2022-08-03 DIAGNOSIS — H5462 Unqualified visual loss, left eye, normal vision right eye: Secondary | ICD-10-CM | POA: Diagnosis not present

## 2022-08-03 DIAGNOSIS — I200 Unstable angina: Principal | ICD-10-CM | POA: Diagnosis present

## 2022-08-03 DIAGNOSIS — I25110 Atherosclerotic heart disease of native coronary artery with unstable angina pectoris: Principal | ICD-10-CM | POA: Diagnosis present

## 2022-08-03 DIAGNOSIS — Z7982 Long term (current) use of aspirin: Secondary | ICD-10-CM | POA: Diagnosis not present

## 2022-08-03 DIAGNOSIS — I10 Essential (primary) hypertension: Secondary | ICD-10-CM | POA: Diagnosis not present

## 2022-08-03 DIAGNOSIS — J449 Chronic obstructive pulmonary disease, unspecified: Secondary | ICD-10-CM | POA: Diagnosis not present

## 2022-08-03 DIAGNOSIS — R079 Chest pain, unspecified: Secondary | ICD-10-CM | POA: Diagnosis not present

## 2022-08-03 LAB — CBC
HCT: 44.6 % (ref 39.0–52.0)
Hemoglobin: 14.9 g/dL (ref 13.0–17.0)
MCH: 30.2 pg (ref 26.0–34.0)
MCHC: 33.4 g/dL (ref 30.0–36.0)
MCV: 90.5 fL (ref 80.0–100.0)
Platelets: 197 10*3/uL (ref 150–400)
RBC: 4.93 MIL/uL (ref 4.22–5.81)
RDW: 12.9 % (ref 11.5–15.5)
WBC: 5.8 10*3/uL (ref 4.0–10.5)
nRBC: 0 % (ref 0.0–0.2)

## 2022-08-03 LAB — PROTIME-INR
INR: 1.1 (ref 0.8–1.2)
Prothrombin Time: 14.2 seconds (ref 11.4–15.2)

## 2022-08-03 LAB — BASIC METABOLIC PANEL
Anion gap: 9 (ref 5–15)
BUN: 11 mg/dL (ref 8–23)
CO2: 26 mmol/L (ref 22–32)
Calcium: 9 mg/dL (ref 8.9–10.3)
Chloride: 100 mmol/L (ref 98–111)
Creatinine, Ser: 0.83 mg/dL (ref 0.61–1.24)
GFR, Estimated: >60 mL/min (ref >60)
Glucose, Bld: 173 mg/dL — ABNORMAL HIGH (ref 70–99)
Potassium: 3.8 mmol/L (ref 3.5–5.1)
Sodium: 135 mmol/L (ref 135–145)

## 2022-08-03 LAB — TROPONIN I (HIGH SENSITIVITY)
Troponin I (High Sensitivity): 3 ng/L (ref <18)
Troponin I (High Sensitivity): 3 ng/L (ref <18)

## 2022-08-03 MED ORDER — FENTANYL CITRATE PF 50 MCG/ML IJ SOSY
50.0000 ug | PREFILLED_SYRINGE | Freq: Once | INTRAMUSCULAR | Status: AC
  Administered 2022-08-03: 50 ug via INTRAVENOUS
  Filled 2022-08-03: qty 1

## 2022-08-03 MED ORDER — NITROGLYCERIN IN D5W 200-5 MCG/ML-% IV SOLN
0.0000 ug/min | INTRAVENOUS | Status: DC
  Administered 2022-08-03: 5 ug/min via INTRAVENOUS
  Filled 2022-08-03: qty 250

## 2022-08-03 MED ORDER — HEPARIN (PORCINE) 25000 UT/250ML-% IV SOLN
1100.0000 [IU]/h | INTRAVENOUS | Status: DC
  Administered 2022-08-03: 1000 [IU]/h via INTRAVENOUS
  Filled 2022-08-03: qty 250

## 2022-08-03 MED ORDER — IOHEXOL 350 MG/ML SOLN
75.0000 mL | Freq: Once | INTRAVENOUS | Status: AC | PRN
  Administered 2022-08-03: 75 mL via INTRAVENOUS

## 2022-08-03 MED ORDER — TIROFIBAN (AGGRASTAT) BOLUS VIA INFUSION
25.0000 ug/kg | Freq: Once | INTRAVENOUS | Status: AC
  Administered 2022-08-03: 2052.5 ug via INTRAVENOUS
  Filled 2022-08-03: qty 42

## 2022-08-03 MED ORDER — HEPARIN BOLUS VIA INFUSION
4000.0000 [IU] | Freq: Once | INTRAVENOUS | Status: AC
  Administered 2022-08-03: 4000 [IU] via INTRAVENOUS
  Filled 2022-08-03: qty 4000

## 2022-08-03 MED ORDER — TIROFIBAN (AGGRASTAT) BOLUS VIA INFUSION
25.0000 ug/kg | Freq: Once | INTRAVENOUS | Status: DC
  Filled 2022-08-03: qty 42

## 2022-08-03 MED ORDER — TIROFIBAN HCL IV 12.5 MG/250 ML
0.1500 ug/kg/min | INTRAVENOUS | Status: DC
  Administered 2022-08-03 – 2022-08-04 (×2): 0.15 ug/kg/min via INTRAVENOUS
  Filled 2022-08-03: qty 100
  Filled 2022-08-03: qty 250
  Filled 2022-08-03 (×2): qty 100

## 2022-08-03 MED ORDER — TIROFIBAN HCL IN NACL 5-0.9 MG/100ML-% IV SOLN
0.1500 ug/kg/min | INTRAVENOUS | Status: DC
  Filled 2022-08-03 (×2): qty 100

## 2022-08-03 NOTE — ED Notes (Signed)
Patient transported to CT 

## 2022-08-03 NOTE — ED Triage Notes (Addendum)
Pt seeing Dr Jacinto Halim for increasing chest pressure for several weeks. Pt states he's been "living on nitro".  Pt saw Dr Jacinto Halim last week and a "dye test" is scheduled for Fri.  However, chest pressure continues to increase, esp when pt stands.  Pt added that all this pain started after he tripped over his dog and fell, hitting his chest.

## 2022-08-03 NOTE — ED Notes (Addendum)
Pt report received from previous nurse. Pt A&O x4, vitals stable, denies needs/complaints, pt resting. Call bell in reach. No acute distress noted.
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
## 2022-08-03 NOTE — Progress Notes (Signed)
Patient seen and examined by me in office on April 11 for acute visit due to chest pain.  We were planning already to schedule cardiac catheterization with Dr. Jacinto Halim.  Patient presented to the ED with unstable angina he had taken about 8 nitroglycerin and his chest pain is still persistent.  Troponins are negative, no STEMI on EKG.  We will admit to our service and plan for cardiac catheterization first thing in the morning.  Discussed with Dr. Jacinto Halim will place patient on heparin, nitro, and Aggrastat drips.  If any acute changes we will plan to take to cardiac catheterization lab immediately.  Clotilde Dieter, DO 08/03/22 6:08 PM Cell: [phone]
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
## 2022-08-03 NOTE — ED Provider Notes (Signed)
[place] EMERGENCY DEPARTMENT AT [facility] Provider Note   CSN: [id number] Arrival date & time: 08/03/22  1107     History  Chief Complaint  Patient presents with   Chest Pain    Sean Bridges is a 77 y.o. male with past medical history significant for hypertension, CAD, CABG, COPD, unstable angina, MI presents to the ED complaining of left-sided chest pain and pressure that has been worsening over the last week.  Patient states he has been "living on nitro" and took approximately 8 doses last night.  He reports that his pain and pressure was worse overnight and he was unable to sleep lying down.  Patient is followed by cardiology and had visit last week, he has a left heart cath scheduled for later this week.  He reports that the chest pain started after he had a fall.  Patient was walking his dog approximately 1 week ago when his dog took chase after a squirrel causing him to fall forward landing directly on his chest against the hard asphalt.  He has increased chest pressure whenever he stands or moves about causing him to "move gingerly".  Denies chest tightness, cough, shortness of breath, leg swelling, palpitations, abdominal pain, nausea, vomiting, syncope, fever.        Home Medications Prior to Admission medications   Medication Sig Start Date End Date Taking? Authorizing Provider  amLODipine (NORVASC) 2.5 MG tablet TAKE 1 TABLET(2.5 MG) BY MOUTH DAILY 12/25/21   Yates Decamp, MD  aspirin EC 81 MG tablet Take 81 mg by mouth daily.    [provider]  atorvastatin (LIPITOR) 20 MG tablet Take 1 tablet (20 mg total) by mouth daily at 6 PM. Patient taking differently: Take 20 mg by mouth daily. 03/01/18   Revankar, Aundra Dubin, MD  meloxicam (MOBIC) 15 MG tablet Take 15 mg by mouth daily. 06/11/21   [provider]  metFORMIN (GLUCOPHAGE-XR) 500 MG 24 hr tablet Take 1 tablet by mouth daily. 07/26/20   [provider]  metoprolol tartrate  (LOPRESSOR) 25 MG tablet Take 0.5 tablets (12.5 mg total) by mouth 2 (two) times daily. 03/01/18   Revankar, Aundra Dubin, MD  nitroGLYCERIN (NITROSTAT) 0.4 MG SL tablet PLACE 1 TABLET UNDER THE TONGUE EVERY 5 MINUTES AS NEEDED FOR CHEST PAIN 07/30/22   Yates Decamp, MD  omeprazole (PRILOSEC) 40 MG capsule Take 40 mg by mouth daily. 04/19/21   [provider]      Allergies    Patient has no known allergies.    Review of Systems   Review of Systems  Constitutional:  Negative for fever.  Respiratory:  Negative for cough, chest tightness and shortness of breath.   Cardiovascular:  Positive for chest pain. Negative for palpitations and leg swelling.  Gastrointestinal:  Negative for abdominal pain, nausea and vomiting.  Neurological:  Negative for syncope.    Physical Exam Updated Vital Signs BP (!) 148/82   Pulse (!) 56   Temp 97.9 F (36.6 C) (Oral)   Resp 15   Ht 5\' 8"  (1.727 m)   Wt 82.1 kg   SpO2 97%   BMI 27.52 kg/m  Physical Exam Vitals and nursing note reviewed.  Constitutional:      General: He is not in acute distress.    Appearance: He is not ill-appearing.  HENT:     Mouth/Throat:     Mouth: Mucous membranes are moist.     Pharynx: Oropharynx is clear.  Cardiovascular:  Rate and Rhythm: Normal rate and regular rhythm.     Pulses: Normal pulses.     Heart sounds: Normal heart sounds.  Pulmonary:     Effort: Pulmonary effort is normal. No tachypnea or respiratory distress.     Breath sounds: Normal breath sounds and air entry.  Chest:     Chest wall: Tenderness present. No deformity, crepitus or edema.    Abdominal:     General: Abdomen is flat. Bowel sounds are normal. There is no distension.     Palpations: Abdomen is soft.     Tenderness: There is no abdominal tenderness.  Musculoskeletal:     Right lower leg: No edema.     Left lower leg: No edema.  Skin:    General: Skin is warm and dry.     Capillary Refill: Capillary refill takes less than 2  seconds.  Neurological:     Mental Status: He is alert. Mental status is at baseline.  Psychiatric:        Mood and Affect: Mood normal.        Behavior: Behavior normal.     ED Results / Procedures / Treatments   Labs (all labs ordered are listed, but only abnormal results are displayed) Labs Reviewed  BASIC METABOLIC PANEL - Abnormal; Notable for the following components:      Result Value   Glucose, Bld 173 (*)    All other components within normal limits  CBC  PROTIME-INR  TROPONIN I (HIGH SENSITIVITY)  TROPONIN I (HIGH SENSITIVITY)    EKG EKG Interpretation  Date/Time:  Sunday August 03 2022 12:42:09 EDT Ventricular Rate:  54 PR Interval:  261 QRS Duration: 100 QT Interval:  421 QTC Calculation: 399 R Axis:   9 Text Interpretation: Sinus rhythm Prolonged PR interval Confirmed by Ernie Avena ([id number]) on 08/03/2022 12:46:46 PM  Radiology CT Angio Chest Aorta W and/or Wo Contrast  Result Date: 08/03/2022 CLINICAL DATA:  Progressively worsening chest pressure for the past several weeks after tripping over his dog and hitting his chest. EXAM: CT ANGIOGRAPHY CHEST WITH CONTRAST TECHNIQUE: Multidetector CT imaging of the chest was performed using the standard protocol during bolus administration of intravenous contrast. Multiplanar CT image reconstructions and MIPs were obtained to evaluate the vascular anatomy. RADIATION DOSE REDUCTION: This exam was performed according to the departmental dose-optimization program which includes automated exposure control, adjustment of the mA and/or kV according to patient size and/or use of iterative reconstruction technique. CONTRAST:  75mL OMNIPAQUE IOHEXOL 350 MG/ML SOLN COMPARISON:  Chest x-ray from same day. FINDINGS: Cardiovascular: Mild cardiomegaly. Prior CABG. No thoracic aortic aneurysm or dissection. Mild atherosclerotic calcification of the thoracic aorta. Chronic complete occlusion of the left common carotid artery. No pulmonary  embolism. Mediastinum/Nodes: No enlarged mediastinal, hilar, or axillary lymph nodes. Thyroid gland, trachea, and esophagus demonstrate no significant findings. Lungs/Pleura: Mild peribronchial thickening. Minimal subsegmental atelectasis in both lower lobes. No focal consolidation, pleural effusion, or pneumothorax. Few scattered small pulmonary nodules in both lungs measuring up to 4 mm. No follow-up imaging is recommended. Upper Abdomen: No acute abnormality. Musculoskeletal: No chest wall abnormality. No acute or significant osseous findings. Review of the MIP images confirms the above findings. IMPRESSION: 1. No acute intrathoracic process. 2. Chronic complete occlusion of the left common carotid artery. 3.  Aortic atherosclerosis (ICD10-I70.0). Electronically Signed   By: Obie Dredge M.D.   On: 08/03/2022 16:33   DG Chest 2 View  Result Date: 08/03/2022 CLINICAL DATA:  Chest pain  and pressure for 3 days EXAM: CHEST - 2 VIEW COMPARISON:  X-ray dated 08/14/2015 FINDINGS: Heart size and mediastinal contours are within normal limits, stable. Median sternotomy wires appear intact and stable in alignment. Lungs are clear. No pleural effusion or pneumothorax is seen. No acute-appearing osseous abnormality. IMPRESSION: No active cardiopulmonary disease. No evidence of pneumonia or pulmonary edema. Electronically Signed   By: Bary Richard M.D.   On: 08/03/2022 11:55    Procedures Procedures    Medications Ordered in ED Medications  nitroGLYCERIN 50 mg in dextrose 5 % 250 mL (0.2 mg/mL) infusion (has no administration in time range)  fentaNYL (SUBLIMAZE) injection 50 mcg (50 mcg Intravenous Given 08/03/22 1345)  iohexol (OMNIPAQUE) 350 MG/ML injection 75 mL (75 mLs Intravenous Contrast Given 08/03/22 1603)    ED Course/ Medical Decision Making/ A&P                             Medical Decision Making Amount and/or Complexity of Data Reviewed Labs: ordered. Radiology:  ordered.  Risk Prescription drug management.   This patient presents to the ED with chief complaint(s) of chest pain with pertinent past medical history of CAD, HTN, HLD, CABG, unstable angina, COPD.  The complaint involves an extensive differential diagnosis and also carries with it a high risk of complications and morbidity.    The differential diagnosis includes ACS, blunt chest trauma, rib fracture, contusion, musculoskeletal strain   The initial plan is to obtain chest pain work up  Additional history obtained: Records reviewed  cardiology notes from 07/31/22, patient was seen for chest pain of uncertain etiology and scheduled for heart cath 08/08/22  Initial Assessment:   Exam significant for tenderness to palpation of left anterior chest wall.  Heart rate is around 58 and regular.  No appreciable murmurs.  Lungs clear to auscultation bilaterally with adequate tidal volume.  Abdomen is soft, non tender and non distended.  No lower extremity edema.    Independent ECG/labs interpretation:  The following labs were independently interpreted:  CBC without leukocytosis or anemia.  Metabolic panel with hyperglycemia, but no other electrolyte derangement.  Renal function is normal.  PT-INR within normal limits.  Delta trop of 0.    Independent visualization and interpretation of imaging: I independently visualized the following imaging with scope of interpretation limited to determining acute life threatening conditions related to emergency care: CTA chest, which revealed no evidence of acute intrathoracic process.  I agree with radiologist interpretation.    Consultations Obtained: I requested consultation with on-call cardiologist with [facility] and spoke with Clotilde Dieter who recommends hospital admission with heart cath procedure in the morning.  Patient will be admitted to cardiology service.  Patient to be started on nitro and heparin drip.   Treatment and  Reassessment: Patient was given fentanyl for his chest pain while awaiting work up completion.  He reports this did help his symptoms, however, his chest pain returned some time later and was requesting nitro.   Disposition:   Patient to be admitted for unstable angina with a heart cath procedure planned for the morning.            Final Clinical Impression(s) / ED Diagnoses Final diagnoses:  Unstable angina    Rx / DC Orders ED Discharge Orders     None         Lenard Simmer, PA-C 08/03/22 1808    Ernie Avena, MD 08/03/22 2027

## 2022-08-04 ENCOUNTER — Other Ambulatory Visit (HOSPITAL_COMMUNITY): Payer: Self-pay

## 2022-08-04 ENCOUNTER — Encounter (HOSPITAL_COMMUNITY): Payer: Self-pay | Admitting: Cardiology

## 2022-08-04 ENCOUNTER — Encounter (HOSPITAL_COMMUNITY)
Admission: EM | Disposition: A | Discharge status: Home / Self Care | Payer: Self-pay | Source: Home / Self Care | Attending: Internal Medicine

## 2022-08-04 HISTORY — PX: LEFT HEART CATH AND CORONARY ANGIOGRAPHY: CATH118249

## 2022-08-04 HISTORY — PX: CARDIAC CATHETERIZATION: SHX172

## 2022-08-04 LAB — HEPARIN LEVEL (UNFRACTIONATED): Heparin Unfractionated: <0.1 IU/mL — ABNORMAL LOW (ref 0.30–0.70)

## 2022-08-04 LAB — GLUCOSE, CAPILLARY: Glucose-Capillary: 166 mg/dL — ABNORMAL HIGH (ref 70–99)

## 2022-08-04 SURGERY — LEFT HEART CATH AND CORONARY ANGIOGRAPHY
Anesthesia: LOCAL

## 2022-08-04 MED ORDER — ACETAMINOPHEN 325 MG PO TABS: 650.0000 mg | ORAL_TABLET | ORAL | Status: DC | PRN

## 2022-08-04 MED ORDER — IOHEXOL 350 MG/ML SOLN
INTRAVENOUS | Status: DC | PRN
  Administered 2022-08-04: 100 mL

## 2022-08-04 MED ORDER — VERAPAMIL HCL 2.5 MG/ML IV SOLN
INTRAVENOUS | Status: DC | PRN
  Administered 2022-08-04: 10 mL via INTRA_ARTERIAL

## 2022-08-04 MED ORDER — FENTANYL CITRATE (PF) 100 MCG/2ML IJ SOLN
INTRAMUSCULAR | Status: AC
  Filled 2022-08-04: qty 2

## 2022-08-04 MED ORDER — SODIUM CHLORIDE 0.9% FLUSH: 3.0000 mL | Freq: Two times a day (BID) | INTRAVENOUS | Status: DC

## 2022-08-04 MED ORDER — SODIUM CHLORIDE 0.9% FLUSH: 3.0000 mL | INTRAVENOUS | Status: DC | PRN

## 2022-08-04 MED ORDER — ASPIRIN 81 MG PO CHEW: 81.0000 mg | CHEWABLE_TABLET | ORAL | Status: DC

## 2022-08-04 MED ORDER — FENTANYL CITRATE (PF) 100 MCG/2ML IJ SOLN
INTRAMUSCULAR | Status: DC | PRN
  Administered 2022-08-04: 50 ug via INTRAVENOUS

## 2022-08-04 MED ORDER — HYDRALAZINE HCL 20 MG/ML IJ SOLN: 10.0000 mg | INTRAMUSCULAR | Status: DC | PRN

## 2022-08-04 MED ORDER — ISOSORBIDE MONONITRATE ER 30 MG PO TB24
30.0000 mg | ORAL_TABLET | Freq: Every day | ORAL | 1 refills | Status: DC
  Filled 2022-08-04: qty 30, 30d supply, fill #0

## 2022-08-04 MED ORDER — SODIUM CHLORIDE 0.9 % IV SOLN: INTRAVENOUS | Status: DC

## 2022-08-04 MED ORDER — HEPARIN SODIUM (PORCINE) 1000 UNIT/ML IJ SOLN
INTRAMUSCULAR | Status: DC | PRN
  Administered 2022-08-04: 4000 [IU] via INTRAVENOUS

## 2022-08-04 MED ORDER — MIDAZOLAM HCL 2 MG/2ML IJ SOLN
INTRAMUSCULAR | Status: DC | PRN
  Administered 2022-08-04: 1 mg via INTRAVENOUS

## 2022-08-04 MED ORDER — ONDANSETRON HCL 4 MG/2ML IJ SOLN: 4.0000 mg | Freq: Four times a day (QID) | INTRAMUSCULAR | Status: DC | PRN

## 2022-08-04 MED ORDER — SODIUM CHLORIDE 0.9 % IV SOLN: 250.0000 mL | INTRAVENOUS | Status: DC | PRN

## 2022-08-04 MED ORDER — VERAPAMIL HCL 2.5 MG/ML IV SOLN
INTRAVENOUS | Status: AC
  Filled 2022-08-04: qty 2

## 2022-08-04 MED ORDER — ASPIRIN 81 MG PO CHEW
81.0000 mg | CHEWABLE_TABLET | ORAL | Status: AC
  Administered 2022-08-04: 81 mg via ORAL
  Filled 2022-08-04: qty 1

## 2022-08-04 MED ORDER — HEPARIN SODIUM (PORCINE) 1000 UNIT/ML IJ SOLN
INTRAMUSCULAR | Status: AC
  Filled 2022-08-04: qty 10

## 2022-08-04 MED ORDER — HEPARIN (PORCINE) IN NACL 1000-0.9 UT/500ML-% IV SOLN
INTRAVENOUS | Status: DC | PRN
  Administered 2022-08-04 (×2): 500 mL

## 2022-08-04 MED ORDER — MIDAZOLAM HCL 2 MG/2ML IJ SOLN
INTRAMUSCULAR | Status: AC
  Filled 2022-08-04: qty 2

## 2022-08-04 MED ORDER — LABETALOL HCL 5 MG/ML IV SOLN: 10.0000 mg | INTRAVENOUS | Status: DC | PRN

## 2022-08-04 MED ORDER — LIDOCAINE HCL (PF) 1 % IJ SOLN
INTRAMUSCULAR | Status: DC | PRN
  Administered 2022-08-04: 2 mL via INTRADERMAL

## 2022-08-04 MED ORDER — SODIUM CHLORIDE 0.9 % IV BOLUS
INTRAVENOUS | Status: DC | PRN
  Administered 2022-08-04: 500 mL via INTRAVENOUS

## 2022-08-04 MED ORDER — LIDOCAINE HCL (PF) 1 % IJ SOLN
INTRAMUSCULAR | Status: AC
  Filled 2022-08-04: qty 30

## 2022-08-04 SURGICAL SUPPLY — 19 items
BAND CMPR LRG ZPHR (HEMOSTASIS) ×1
BAND ZEPHYR COMPRESS 30 LONG (HEMOSTASIS) IMPLANT
CATH EXPO 5F MPA-1 (CATHETERS) IMPLANT
CATH INFINITI 5 FR IM (CATHETERS) IMPLANT
CATH INFINITI 5 FR JL3.5 (CATHETERS) IMPLANT
CATH INFINITI 5 FR LCB (CATHETERS) IMPLANT
CATH INFINITI 5FR JL4 (CATHETERS) IMPLANT
CATH INFINITI JR4 5F (CATHETERS) IMPLANT
CATH OPTITORQUE TIG 4.0 5F (CATHETERS) IMPLANT
COVER DOME SNAP 22 D (MISCELLANEOUS) IMPLANT
GLIDESHEATH SLEND A-KIT 6F 22G (SHEATH) IMPLANT
GUIDEWIRE INQWIRE 1.5J.035X260 (WIRE) IMPLANT
INQWIRE 1.5J .035X260CM (WIRE) ×1
KIT HEART LEFT (KITS) ×1 IMPLANT
PACK CARDIAC CATHETERIZATION (CUSTOM PROCEDURE TRAY) ×1 IMPLANT
SHEATH PROBE COVER 6X72 (BAG) IMPLANT
SYR MEDRAD MARK 7 150ML (SYRINGE) ×1 IMPLANT
TRANSDUCER W/STOPCOCK (MISCELLANEOUS) ×1 IMPLANT
TUBING CIL FLEX 10 FLL-RA (TUBING) ×1 IMPLANT

## 2022-08-04 NOTE — ED Notes (Signed)
Pt transported to cath lab.  

## 2022-08-04 NOTE — Progress Notes (Signed)
TR BAND REMOVAL  LOCATION:    left radial  DEFLATED PER PROTOCOL:    Yes.    TIME BAND OFF / DRESSING APPLIED:    1050 gauze dressing applied   SITE UPON ARRIVAL:    Level 0  SITE AFTER BAND REMOVAL:    Level 0  CIRCULATION SENSATION AND MOVEMENT:    Within Normal Limits   Yes.    COMMENTS:   no issues noted

## 2022-08-04 NOTE — Discharge Instructions (Signed)

## 2022-08-04 NOTE — Interval H&P Note (Signed)
History and Physical Interval Note:  08/04/2022 7:43 AM  Sean Bridges  has presented today for surgery, with the diagnosis of unstable angina.  The various methods of treatment have been discussed with the patient and family. After consideration of risks, benefits and other options for treatment, the patient has consented to  Procedure(s): LEFT HEART CATH AND CORONARY ANGIOGRAPHY (N/A) as a surgical intervention.  The patient's history has been reviewed, patient examined, no change in status, stable for surgery.  I have reviewed the patient's chart and labs.  Questions were answered to the patient's satisfaction.    2016 Appropriate Use Criteria for Coronary Revascularization in Patients With Acute Coronary Syndrome NSTEMI/Unstable angina, stabilized patient at Intermediate Risk (TIMI Score 3-4) Indication:  Revascularization by PCI or CABG of 1 or more arteries in a patient with NSTEMI or unstable angina with Stabilization after presentation Intermediate risk for clinical events A (7) Indication: 16; Score 7   Stanley Lyness J Roark Rufo

## 2022-08-04 NOTE — Progress Notes (Addendum)
ANTICOAGULATION CONSULT NOTE - Follow Up Consult  Pharmacy Consult for heparin Indication:  USAP  Labs: Recent Labs    08/03/22 1131 08/03/22 1347 08/04/22 0235  HGB 14.9  --   --   HCT 44.6  --   --   PLT 197  --   --   LABPROT 14.2  --   --   INR 1.1  --   --   HEPARINUNFRC  --   --  <0.10*  CREATININE 0.83  --   --   TROPONINIHS 3 3  --     Assessment: 76yo male subtherapeutic on heparin with initial dosing for CP; also on tirofiban; no bleeding or infusion issues per RN.  Goal of Therapy:  Heparin level 0.3-0.5 units/ml while on tirofiban   Plan:  Will increase heparin infusion by 3 units/kg/hr to 1100 units/hr and check level in 6 hours; will also check CBC.    Vernard Gambles, PharmD, BCPS  08/04/2022,3:27 AM
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
## 2022-08-05 ENCOUNTER — Other Ambulatory Visit (HOSPITAL_COMMUNITY): Payer: Self-pay

## 2022-08-05 ENCOUNTER — Other Ambulatory Visit: Payer: Self-pay | Admitting: Cardiology

## 2022-08-08 ENCOUNTER — Ambulatory Visit (HOSPITAL_COMMUNITY): Admit: 2022-08-08 | Payer: PPO | Admitting: Cardiology

## 2022-08-08 ENCOUNTER — Encounter (HOSPITAL_COMMUNITY): Payer: Self-pay

## 2022-08-08 SURGERY — LEFT HEART CATH AND CORS/GRAFTS ANGIOGRAPHY
Anesthesia: LOCAL

## 2022-08-08 NOTE — Discharge Summary (Signed)
Physician Discharge Summary  Patient ID: Sean Bridges MRN: [id number] DOB/AGE: 75-Apr-1947 75 y.o.  Admit date: 08/03/2022 Discharge date: 08/08/2022  Primary Discharge Diagnosis: Coronary artery disease  Secondary Discharge Diagnosis: Coronary artery disease   Hospital Course:   77 y/o Caucasian male with hyperlipidemia, diabetes mellitus, former tobacco use that quit around 2018, COPD, CAD s/p CABG x4 in Nov 2016, admitted with chest pain concerning for unstable angina  Coronary and bypass graft angiography details below.     Discharge Exam: Blood pressure 139/80, pulse 71, temperature 99.1 F (37.3 C), temperature source Oral, resp. rate 17, height  (1.727 m), weight 82.1 kg, SpO2 94 %.   Physical Exam Vitals and nursing note reviewed.  Constitutional:      General: He is not in acute distress. Neck:     Vascular: No JVD.  Cardiovascular:     Rate and Rhythm: Normal rate and regular rhythm.     Heart sounds: Normal heart sounds. No murmur heard. Pulmonary:     Effort: Pulmonary effort is normal.     Breath sounds: Normal breath sounds. No wheezing or rales.  Musculoskeletal:     Right lower leg: No edema.     Left lower leg: No edema.       Significant Diagnostic Studies:  EKG 08/03/2022: Sinus rhythm First degree AV block  Coronary and bypass graft angiography 08/04/2022: LM: 20% disease LAD: Distal occlusion, patent LIMA-LAD graft RI: Not visualized, possible ostial occlusion, SVG-RI graft occluded. Lcx: OM1: small caliber diffuse 90 disease        OM2: Moderate calibver, proximal 90% stenosis, bypasses by patent SVG-OM2 graft RCA: Prox occlusion, few bridging collaterals   LVEDP normal Inferior hypokinesis., LVEF 50-55%   Patient certainly has reasons to have ischemia. However, his current chest pain symptoms are more positional. He does not have any chest pain with walking or physical activity. I have added Imdur 30 mg daily in case there  is indeed any component of true angina symptoms. If angina occurs in spite of optimal anti anginal therapy, could consider RCA CTO PCI.   FOLLOW UP PLANS AND APPOINTMENTS  Allergies as of 08/04/2022   No Known Allergies      Medication List     TAKE these medications    amLODipine 2.5 MG tablet Commonly known as: NORVASC TAKE 1 TABLET(2.5 MG) BY MOUTH DAILY What changed: See the new instructions.   aspirin EC 81 MG tablet Take 81 mg by mouth daily.   atorvastatin 20 MG tablet Commonly known as: LIPITOR Take 1 tablet (20 mg total) by mouth daily at 6 PM. What changed: when to take this   meloxicam 15 MG tablet Commonly known as: MOBIC Take 15 mg by mouth daily.   metFORMIN 500 MG 24 hr tablet Commonly known as: GLUCOPHAGE-XR Take 1 tablet by mouth daily.   metoprolol tartrate 25 MG tablet Commonly known as: LOPRESSOR Take 0.5 tablets (12.5 mg total) by mouth 2 (two) times daily.   nitroGLYCERIN 0.4 MG SL tablet Commonly known as: NITROSTAT PLACE 1 TABLET UNDER THE TONGUE EVERY 5 MINUTES AS NEEDED FOR CHEST PAIN What changed: See the new instructions.   omeprazole 40 MG capsule Commonly known as: PRILOSEC Take 40 mg by mouth daily as needed (for acid reflux).           Elder Negus, MD Pager: [phone] Office: [phone]

## 2022-08-14 DIAGNOSIS — I2510 Atherosclerotic heart disease of native coronary artery without angina pectoris: Secondary | ICD-10-CM | POA: Diagnosis not present

## 2022-08-14 DIAGNOSIS — E1169 Type 2 diabetes mellitus with other specified complication: Secondary | ICD-10-CM | POA: Diagnosis not present

## 2022-08-14 DIAGNOSIS — Z09 Encounter for follow-up examination after completed treatment for conditions other than malignant neoplasm: Secondary | ICD-10-CM | POA: Diagnosis not present

## 2022-08-14 DIAGNOSIS — R079 Chest pain, unspecified: Secondary | ICD-10-CM | POA: Diagnosis not present

## 2022-08-14 DIAGNOSIS — J432 Centrilobular emphysema: Secondary | ICD-10-CM | POA: Diagnosis not present

## 2022-08-14 DIAGNOSIS — N182 Chronic kidney disease, stage 2 (mild): Secondary | ICD-10-CM | POA: Diagnosis not present

## 2022-08-14 DIAGNOSIS — I10 Essential (primary) hypertension: Secondary | ICD-10-CM | POA: Diagnosis not present

## 2022-08-14 DIAGNOSIS — E782 Mixed hyperlipidemia: Secondary | ICD-10-CM | POA: Diagnosis not present

## 2022-08-18 ENCOUNTER — Encounter: Payer: Self-pay | Admitting: Cardiology

## 2022-08-18 ENCOUNTER — Ambulatory Visit: Payer: PPO | Admitting: Cardiology

## 2022-08-18 VITALS — BP 126/72 | HR 69 | Ht 68.0 in | Wt 183.0 lb

## 2022-08-18 DIAGNOSIS — Z951 Presence of aortocoronary bypass graft: Secondary | ICD-10-CM

## 2022-08-18 DIAGNOSIS — R0789 Other chest pain: Secondary | ICD-10-CM | POA: Diagnosis not present

## 2022-08-18 DIAGNOSIS — I25118 Atherosclerotic heart disease of native coronary artery with other forms of angina pectoris: Secondary | ICD-10-CM

## 2022-08-18 DIAGNOSIS — I10 Essential (primary) hypertension: Secondary | ICD-10-CM | POA: Diagnosis not present

## 2022-08-18 DIAGNOSIS — I25708 Atherosclerosis of coronary artery bypass graft(s), unspecified, with other forms of angina pectoris: Secondary | ICD-10-CM | POA: Diagnosis not present

## 2022-08-18 MED ORDER — LOSARTAN POTASSIUM 25 MG PO TABS
25.0000 mg | ORAL_TABLET | Freq: Every evening | ORAL | 3 refills | Status: DC
Start: 2022-08-18 — End: 2023-08-05
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
## 2022-09-02 DIAGNOSIS — R079 Chest pain, unspecified: Secondary | ICD-10-CM | POA: Diagnosis not present

## 2022-09-02 DIAGNOSIS — I25110 Atherosclerotic heart disease of native coronary artery with unstable angina pectoris: Secondary | ICD-10-CM | POA: Diagnosis not present

## 2022-09-02 DIAGNOSIS — I200 Unstable angina: Secondary | ICD-10-CM | POA: Diagnosis not present

## 2022-09-02 DIAGNOSIS — Z951 Presence of aortocoronary bypass graft: Secondary | ICD-10-CM | POA: Diagnosis not present

## 2022-09-03 LAB — BASIC METABOLIC PANEL
BUN/Creatinine Ratio: 13 (ref 10–24)
BUN: 10 mg/dL (ref 8–27)
CO2: 22 mmol/L (ref 20–29)
Calcium: 9.2 mg/dL (ref 8.6–10.2)
Chloride: 101 mmol/L (ref 96–106)
Creatinine, Ser: 0.8 mg/dL (ref 0.76–1.27)
Glucose: 135 mg/dL — ABNORMAL HIGH (ref 70–99)
Potassium: 4.4 mmol/L (ref 3.5–5.2)
Sodium: 138 mmol/L (ref 134–144)
eGFR: 93 mL/min/{1.73_m2} (ref >59)

## 2022-09-03 LAB — CBC
Hematocrit: 45.3 % (ref 37.5–51.0)
Hemoglobin: 15.4 g/dL (ref 13.0–17.7)
MCH: 30.4 pg (ref 26.6–33.0)
MCHC: 34 g/dL (ref 31.5–35.7)
MCV: 90 fL (ref 79–97)
Platelets: 172 10*3/uL (ref 150–450)
RBC: 5.06 x10E6/uL (ref 4.14–5.80)
RDW: 13 % (ref 11.6–15.4)
WBC: 4.9 10*3/uL (ref 3.4–10.8)

## 2022-09-22 ENCOUNTER — Ambulatory Visit: Payer: PPO

## 2022-09-22 DIAGNOSIS — I25708 Atherosclerosis of coronary artery bypass graft(s), unspecified, with other forms of angina pectoris: Secondary | ICD-10-CM | POA: Diagnosis not present

## 2022-09-22 DIAGNOSIS — I25118 Atherosclerotic heart disease of native coronary artery with other forms of angina pectoris: Secondary | ICD-10-CM

## 2022-09-22 NOTE — Progress Notes (Signed)
Echocardiogram 09/22/2022: Left ventricle cavity is normal in size. Normal left ventricular wall thickness. Normal global wall motion. Normal LV systolic function with EF 57%. Normal diastolic filling pattern.  Mild (Grade I) mitral regurgitation. No evidence of pulmonary hypertension. Compared to previous study in 2021, LVEF has improved from 40-45%. Personal review: Mild inferior hypokinesis.

## 2022-10-31 DIAGNOSIS — D485 Neoplasm of uncertain behavior of skin: Secondary | ICD-10-CM | POA: Diagnosis not present

## 2022-10-31 DIAGNOSIS — D692 Other nonthrombocytopenic purpura: Secondary | ICD-10-CM | POA: Diagnosis not present

## 2022-10-31 DIAGNOSIS — L821 Other seborrheic keratosis: Secondary | ICD-10-CM | POA: Diagnosis not present

## 2022-12-11 ENCOUNTER — Other Ambulatory Visit: Payer: Self-pay | Admitting: Cardiology

## 2022-12-11 DIAGNOSIS — I209 Angina pectoris, unspecified: Secondary | ICD-10-CM

## 2022-12-11 DIAGNOSIS — I25118 Atherosclerotic heart disease of native coronary artery with other forms of angina pectoris: Secondary | ICD-10-CM

## 2022-12-23 DIAGNOSIS — E1165 Type 2 diabetes mellitus with hyperglycemia: Secondary | ICD-10-CM | POA: Diagnosis not present

## 2022-12-23 DIAGNOSIS — E782 Mixed hyperlipidemia: Secondary | ICD-10-CM | POA: Diagnosis not present

## 2022-12-23 DIAGNOSIS — N182 Chronic kidney disease, stage 2 (mild): Secondary | ICD-10-CM | POA: Diagnosis not present

## 2022-12-30 DIAGNOSIS — I10 Essential (primary) hypertension: Secondary | ICD-10-CM | POA: Diagnosis not present

## 2022-12-30 DIAGNOSIS — E1169 Type 2 diabetes mellitus with other specified complication: Secondary | ICD-10-CM | POA: Diagnosis not present

## 2022-12-30 DIAGNOSIS — J432 Centrilobular emphysema: Secondary | ICD-10-CM | POA: Diagnosis not present

## 2022-12-30 DIAGNOSIS — I2510 Atherosclerotic heart disease of native coronary artery without angina pectoris: Secondary | ICD-10-CM | POA: Diagnosis not present

## 2022-12-30 DIAGNOSIS — E782 Mixed hyperlipidemia: Secondary | ICD-10-CM | POA: Diagnosis not present

## 2022-12-30 DIAGNOSIS — N182 Chronic kidney disease, stage 2 (mild): Secondary | ICD-10-CM | POA: Diagnosis not present

## 2023-02-17 ENCOUNTER — Ambulatory Visit: Payer: PPO | Admitting: Cardiology

## 2023-03-18 ENCOUNTER — Encounter: Payer: Self-pay | Admitting: Cardiology

## 2023-03-18 ENCOUNTER — Ambulatory Visit: Payer: PPO | Attending: Cardiology | Admitting: Cardiology

## 2023-03-18 VITALS — BP 120/74 | HR 59 | Ht 68.5 in | Wt 176.6 lb

## 2023-03-18 DIAGNOSIS — E7800 Pure hypercholesterolemia, unspecified: Secondary | ICD-10-CM

## 2023-03-18 DIAGNOSIS — I10 Essential (primary) hypertension: Secondary | ICD-10-CM

## 2023-03-18 DIAGNOSIS — I25708 Atherosclerosis of coronary artery bypass graft(s), unspecified, with other forms of angina pectoris: Secondary | ICD-10-CM

## 2023-03-18 DIAGNOSIS — I25118 Atherosclerotic heart disease of native coronary artery with other forms of angina pectoris: Secondary | ICD-10-CM | POA: Diagnosis not present

## 2023-03-18 MED ORDER — NITROGLYCERIN 0.4 MG SL SUBL
0.4000 mg | SUBLINGUAL_TABLET | SUBLINGUAL | 3 refills | Status: AC | PRN
Start: 2023-03-18 — End: ?

## 2023-03-18 NOTE — Progress Notes (Signed)
Cardiology Office Note:  .   Date:  03/18/2023  ID:  Sean Bridges, DOB 11-30-1945, MRN [id number] PCP: Georgianne Fick, MD  [facility] Providers Cardiologist:  Yates Decamp, MD   History of Present Illness: .   Sean Bridges is a 77 y.o. Caucasian male  with with hyperlipidemia, diabetes mellitus, former tobacco use that quit around 2018, COPD, CAD s/p CABG x4 in Nov 2016.     He was admitted to the hospital 12/03/2022 for which he underwent cardiac catheterization revealing occluded vein graft to ramus intermedius and also right coronary artery.  However he had collaterals to the same.  He was recommended medical therapy.   Discussed the use of AI scribe software for clinical note transcription with the patient, who gave verbal consent to proceed.  History of Present Illness   The patient, with a history of occluded vein grafts and bradycardia, presents for a routine follow-up visit. He is currently taking aspirin, amlodipine, Imdur, metoprolol tartrate, and atorvastatin. He also takes over-the-counter supplements three times a day. He denies any chest pain and has not used any nitroglycerin under the tongue. He reports no concerns or symptoms at this time.        Review of Systems  Cardiovascular:  Negative for chest pain, dyspnea on exertion and leg swelling.    Labs    Lab Results  Component Value Date   NA 138 09/02/2022   K 4.4 09/02/2022   CO2 22 09/02/2022   GLUCOSE 135 (H) 09/02/2022   BUN 10 09/02/2022   CREATININE 0.80 09/02/2022   CALCIUM 9.2 09/02/2022   EGFR 93 09/02/2022   GFRNONAA >60 08/03/2022      Latest Ref Rng & Units 09/02/2022    7:35 AM 08/03/2022   11:31 AM 12/31/2020    3:25 PM  BMP  Glucose 70 - 99 mg/dL 409  811  914   BUN 8 - 27 mg/dL 10  11  21    Creatinine 0.76 - 1.27 mg/dL 7.82  9.56  2.13   BUN/Creat Ratio 10 - 24 13     Sodium 134 - 144 mmol/L 138  135  134   Potassium 3.5 - 5.2 mmol/L 4.4  3.8  3.7   Chloride  96 - 106 mmol/L 101  100  98   CO2 20 - 29 mmol/L 22  26  26    Calcium 8.6 - 10.2 mg/dL 9.2  9.0  9.5        Latest Ref Rng & Units 09/02/2022    7:35 AM 08/03/2022   11:31 AM 12/31/2020    3:25 PM  CBC  WBC 3.4 - 10.8 x10E3/uL 4.9  5.8  12.4   Hemoglobin 13.0 - 17.7 g/dL 08.6  57.8  46.9   Hematocrit 37.5 - 51.0 % 45.3  44.6  47.4   Platelets 150 - 450 x10E3/uL 172  197  180     External Labs:  Labs 05/28/2022:   Total cholesterol 124, triglycerides 160, HDL 33, LDL 59.  Non-HDL cholesterol 91.   TSH borderline elevated at 5.36.   Sodium 138, potassium 4.7, BUN 10, creatinine 0.88, EGFR 84 mL.   Hb 16.1/HCT 46.4, platelets 228, normal indicis.   Physical Exam:   VS:  BP 120/74   Pulse (!) 59   Ht 5' 8.5" (1.74 m)   Wt 176 lb 9.6 oz (80.1 kg)   SpO2 98%   BMI 26.46 kg/m    Wt Readings from Last  3 Encounters:  03/18/23 176 lb 9.6 oz (80.1 kg)  08/18/22 183 lb (83 kg)  08/03/22 181 lb (82.1 kg)     Physical Exam Neck:     Vascular: No carotid bruit or JVD.  Cardiovascular:     Rate and Rhythm: Normal rate and regular rhythm.     Pulses: Intact distal pulses.     Heart sounds: Normal heart sounds. No murmur heard.    No gallop.  Pulmonary:     Effort: Pulmonary effort is normal.     Breath sounds: Normal breath sounds.  Abdominal:     General: Bowel sounds are normal.     Palpations: Abdomen is soft.  Musculoskeletal:     Right lower leg: No edema.     Left lower leg: No edema.     Studies Reviewed: Marland Kitchen    Left Heart Catheterization 08/04/22:  LM: 20% disease LAD: Distal occlusion, patent LIMA-LAD graft RI: Not visualized, possible ostial occlusion, SVG-RI graft occluded. Lcx: OM1: small caliber diffuse 90 disease        OM2: Moderate calibver, proximal 90% stenosis, bypasses by patent SVG-OM2 graft RCA: Prox occlusion, few bridging collaterals SVG to RCA is occluded.   LVEDP normal Inferior hypokinesis., LVEF 50-55%    Echocardiogram  09/22/2022: Left ventricle cavity is normal in size. Normal left ventricular wall thickness. Normal global wall motion. Normal LV systolic function with EF 57%. Normal diastolic filling pattern. Mild (Grade I) mitral regurgitation. No evidence of pulmonary hypertension. Compared to previous study in 2021, LVEF has improved from 40-45%.  EKG:    EKG Interpretation Date/Time:  Wednesday March 18 2023 09:02:59 EST Ventricular Rate:  59 PR Interval:  230 QRS Duration:  88 QT Interval:  430 QTC Calculation: 425 R Axis:   62  Text Interpretation: EKG 03/18/2023: Sinus rhythm with first-degree AV block at rate of 59 bpm, inferior infarct 4.  Incomplete right bundle branch block.  No evidence of ischemia.  Compared to 08/03/2022, no significant change. Confirmed by Delrae Rend [phone]) on 03/18/2023 9:20:04 AM    EKG 06/30/2022: Sinus rhythm with first-degree AV block rate of 64 bpm, inferior infarct old, posterior infarct old. Nonspecific T abnormality.   Medications and allergies    No Known Allergies   Current Outpatient Medications:    amLODipine (NORVASC) 2.5 MG tablet, Take 1 tablet (2.5 mg total) by mouth daily., Disp: 30 tablet, Rfl: 3   aspirin EC 81 MG tablet, Take 81 mg by mouth daily., Disp: , Rfl:    atorvastatin (LIPITOR) 20 MG tablet, Take 1 tablet (20 mg total) by mouth daily at 6 PM. (Patient taking differently: Take 20 mg by mouth daily.), Disp: 30 tablet, Rfl: 0   isosorbide mononitrate (IMDUR) 30 MG 24 hr tablet, TAKE 1 TABLET BY MOUTH EVERY DAY, Disp: 90 tablet, Rfl: 3   losartan (COZAAR) 25 MG tablet, Take 1 tablet (25 mg total) by mouth every evening., Disp: 90 tablet, Rfl: 3   meloxicam (MOBIC) 15 MG tablet, Take 15 mg by mouth daily., Disp: , Rfl:    metFORMIN (GLUCOPHAGE-XR) 500 MG 24 hr tablet, Take 1 tablet by mouth daily., Disp: , Rfl:    metoprolol tartrate (LOPRESSOR) 25 MG tablet, Take 0.5 tablets (12.5 mg total) by mouth 2 (two) times daily., Disp: 30  tablet, Rfl: 0   omeprazole (PRILOSEC) 40 MG capsule, Take 40 mg by mouth daily as needed (for acid reflux)., Disp: , Rfl:    OVER THE COUNTER MEDICATION, Take  3,000 mg by mouth daily at 2 PM. Super Beets, Disp: , Rfl:    nitroGLYCERIN (NITROSTAT) 0.4 MG SL tablet, Place 1 tablet (0.4 mg total) under the tongue every 5 (five) minutes as needed for chest pain., Disp: 25 tablet, Rfl: 3   ASSESSMENT AND PLAN: .      ICD-10-CM   1. Coronary artery disease of native artery of native heart with stable angina pectoris (HCC)  I25.118 EKG 12-Lead    nitroGLYCERIN (NITROSTAT) 0.4 MG SL tablet    2. Coronary artery disease of bypass graft of native heart with stable angina pectoris (HCC)  I25.708 EKG 12-Lead    3. Primary hypertension  I10 EKG 12-Lead    4. Pure hypercholesterolemia  E78.00 EKG 12-Lead      1. Coronary artery disease of native artery of native heart with stable angina pectoris [facility]) Patient presently on aspirin, amlodipine, Imdur, metoprolol tartrate at low-dose in view of bradycardia and remains asymptomatic without angina pectoris.  No change in his physical exam.  2. Coronary artery disease of bypass graft of native heart with stable angina pectoris (HCC) As dictated above, he does have occluded vein grafts but remains asymptomatic.  His echocardiogram from June 2024 reveals normal LVEF.  3. Primary hypertension Blood pressure is under excellent control with losartan and metoprolol, continue the same dose.  External labs reviewed.  Renal function has remained stable.  4. Pure hypercholesterolemia Lipids being managed by atorvastatin 20 mg daily, reviewed his external labs, LDL is at goal.  Continue the same.  He remains asymptomatic, I will see him back in a year or sooner if problems.  I have refilled his prescription for nitroglycerin sublingual.   Signed,  Yates Decamp, MD, [facility] 03/18/2023, 9:23 AM [facility] [address] [place], [place] [address] Phone:  [phone]. Fax:  [phone]

## 2023-03-18 NOTE — Patient Instructions (Signed)

## 2023-03-31 DIAGNOSIS — I2510 Atherosclerotic heart disease of native coronary artery without angina pectoris: Secondary | ICD-10-CM | POA: Diagnosis not present

## 2023-03-31 DIAGNOSIS — E782 Mixed hyperlipidemia: Secondary | ICD-10-CM | POA: Diagnosis not present

## 2023-03-31 DIAGNOSIS — N182 Chronic kidney disease, stage 2 (mild): Secondary | ICD-10-CM | POA: Diagnosis not present

## 2023-03-31 DIAGNOSIS — E1169 Type 2 diabetes mellitus with other specified complication: Secondary | ICD-10-CM | POA: Diagnosis not present

## 2023-04-07 DIAGNOSIS — I2510 Atherosclerotic heart disease of native coronary artery without angina pectoris: Secondary | ICD-10-CM | POA: Diagnosis not present

## 2023-04-07 DIAGNOSIS — J432 Centrilobular emphysema: Secondary | ICD-10-CM | POA: Diagnosis not present

## 2023-04-07 DIAGNOSIS — I10 Essential (primary) hypertension: Secondary | ICD-10-CM | POA: Diagnosis not present

## 2023-04-07 DIAGNOSIS — M150 Primary generalized (osteo)arthritis: Secondary | ICD-10-CM | POA: Diagnosis not present

## 2023-04-07 DIAGNOSIS — E1165 Type 2 diabetes mellitus with hyperglycemia: Secondary | ICD-10-CM | POA: Diagnosis not present

## 2023-04-07 DIAGNOSIS — N182 Chronic kidney disease, stage 2 (mild): Secondary | ICD-10-CM | POA: Diagnosis not present

## 2023-04-07 DIAGNOSIS — E782 Mixed hyperlipidemia: Secondary | ICD-10-CM | POA: Diagnosis not present

## 2023-04-24 ENCOUNTER — Other Ambulatory Visit: Payer: Self-pay | Admitting: Cardiology

## 2023-04-24 DIAGNOSIS — I25118 Atherosclerotic heart disease of native coronary artery with other forms of angina pectoris: Secondary | ICD-10-CM

## 2023-04-24 DIAGNOSIS — I209 Angina pectoris, unspecified: Secondary | ICD-10-CM

## 2023-06-22 ENCOUNTER — Other Ambulatory Visit: Payer: Self-pay | Admitting: Cardiology

## 2023-06-30 ENCOUNTER — Ambulatory Visit: Payer: PPO | Admitting: Cardiology

## 2023-08-05 ENCOUNTER — Other Ambulatory Visit: Payer: Self-pay | Admitting: Cardiology

## 2023-08-05 DIAGNOSIS — I25118 Atherosclerotic heart disease of native coronary artery with other forms of angina pectoris: Secondary | ICD-10-CM

## 2023-09-04 DIAGNOSIS — J432 Centrilobular emphysema: Secondary | ICD-10-CM | POA: Diagnosis not present

## 2023-09-04 DIAGNOSIS — E782 Mixed hyperlipidemia: Secondary | ICD-10-CM | POA: Diagnosis not present

## 2023-09-04 DIAGNOSIS — R5383 Other fatigue: Secondary | ICD-10-CM | POA: Diagnosis not present

## 2023-09-04 DIAGNOSIS — E1165 Type 2 diabetes mellitus with hyperglycemia: Secondary | ICD-10-CM | POA: Diagnosis not present

## 2023-09-08 DIAGNOSIS — I25118 Atherosclerotic heart disease of native coronary artery with other forms of angina pectoris: Secondary | ICD-10-CM | POA: Diagnosis not present

## 2023-09-08 DIAGNOSIS — E782 Mixed hyperlipidemia: Secondary | ICD-10-CM | POA: Diagnosis not present

## 2023-09-08 DIAGNOSIS — N182 Chronic kidney disease, stage 2 (mild): Secondary | ICD-10-CM | POA: Diagnosis not present

## 2023-09-08 DIAGNOSIS — M150 Primary generalized (osteo)arthritis: Secondary | ICD-10-CM | POA: Diagnosis not present

## 2023-09-08 DIAGNOSIS — I10 Essential (primary) hypertension: Secondary | ICD-10-CM | POA: Diagnosis not present

## 2023-09-08 DIAGNOSIS — E1165 Type 2 diabetes mellitus with hyperglycemia: Secondary | ICD-10-CM | POA: Diagnosis not present

## 2023-09-08 DIAGNOSIS — Z0000 Encounter for general adult medical examination without abnormal findings: Secondary | ICD-10-CM | POA: Diagnosis not present

## 2023-09-08 DIAGNOSIS — J432 Centrilobular emphysema: Secondary | ICD-10-CM | POA: Diagnosis not present

## 2023-11-03 DIAGNOSIS — G5603 Carpal tunnel syndrome, bilateral upper limbs: Secondary | ICD-10-CM | POA: Diagnosis not present

## 2023-12-11 ENCOUNTER — Telehealth: Payer: Self-pay | Admitting: Acute Care

## 2023-12-11 DIAGNOSIS — Z122 Encounter for screening for malignant neoplasm of respiratory organs: Secondary | ICD-10-CM

## 2023-12-11 DIAGNOSIS — Z87891 Personal history of nicotine dependence: Secondary | ICD-10-CM

## 2023-12-11 NOTE — Telephone Encounter (Signed)
 Lung Cancer Screening Narrative/Criteria Questionnaire (Cigarette Smokers Only- No Cigars/Pipes/vapes)   Sean Bridges  SDMV:12/18/23 at 830a/[place]                                           10/02/1945                LDCT: 12/23/23 at 0900a/[facility]    78 y.o.   Phone: [phone]  Lung Screening Narrative (confirm age 6-77 yrs Medicare / 50-80 yrs Private pay insurance)   Insurance information:HTA   Referring Provider:Ramachandran   This screening involves an initial phone call with a team member from our program. It is called a shared decision making visit. The initial meeting is required by insurance and Medicare to make sure you understand the program. This appointment takes about 15-20 minutes to complete. The CT scan will completed at a separate date/time. This scan takes about 5-10 minutes to complete and you may eat and drink before and after the scan.  Criteria questions for Lung Cancer Screening:   Are you a current or former smoker? Former Age began smoking: 20y   If you are a former smoker, what year did you quit smoking? 2017   To calculate your smoking history, I need an accurate estimate of how many packs of cigarettes you smoked per day and for how many years. (Not just the number of PPD you are now smoking)   Years smoking 50 x Packs per day 1 = Pack years 50   (at least 20 pack yrs)   (Make sure they understand that we need to know how much they have smoked in the past, not just the number of PPD they are smoking now)  Do you have a personal history of cancer?  No    Do you have a family history of cancer? No  Are you coughing up blood?  No  Have you had unexplained weight loss of 15 lbs or more in the last 6 months? No  It looks like you meet all criteria.     Additional information: N/A

## 2023-12-18 ENCOUNTER — Encounter

## 2023-12-23 ENCOUNTER — Other Ambulatory Visit

## 2024-01-27 ENCOUNTER — Other Ambulatory Visit: Payer: Self-pay | Admitting: Cardiology

## 2024-01-27 DIAGNOSIS — I209 Angina pectoris, unspecified: Secondary | ICD-10-CM

## 2024-01-27 DIAGNOSIS — I25118 Atherosclerotic heart disease of native coronary artery with other forms of angina pectoris: Secondary | ICD-10-CM

## 2024-03-22 DIAGNOSIS — I25118 Atherosclerotic heart disease of native coronary artery with other forms of angina pectoris: Secondary | ICD-10-CM | POA: Diagnosis not present

## 2024-03-22 DIAGNOSIS — M150 Primary generalized (osteo)arthritis: Secondary | ICD-10-CM | POA: Diagnosis not present

## 2024-03-22 DIAGNOSIS — I10 Essential (primary) hypertension: Secondary | ICD-10-CM | POA: Diagnosis not present

## 2024-03-22 DIAGNOSIS — N182 Chronic kidney disease, stage 2 (mild): Secondary | ICD-10-CM | POA: Diagnosis not present

## 2024-03-22 DIAGNOSIS — E782 Mixed hyperlipidemia: Secondary | ICD-10-CM | POA: Diagnosis not present

## 2024-03-22 DIAGNOSIS — J432 Centrilobular emphysema: Secondary | ICD-10-CM | POA: Diagnosis not present

## 2024-04-01 ENCOUNTER — Encounter: Payer: Self-pay | Admitting: *Deleted

## 2024-04-01 ENCOUNTER — Ambulatory Visit: Admitting: *Deleted

## 2024-04-01 DIAGNOSIS — Z87891 Personal history of nicotine dependence: Secondary | ICD-10-CM | POA: Diagnosis not present

## 2024-04-01 NOTE — Progress Notes (Signed)
 Virtual Visit via Telephone Note  I connected with Sean Bridges on 04/01/2024 at  9:30 AM EST by telephone and verified that I am speaking with the correct person using two identifiers.  Location: Patient: at home Provider: [address] [place], [place], [address]    I discussed the limitations, risks, security and privacy concerns of performing an evaluation and management service by telephone and the availability of in person appointments. I also discussed with the patient that there may be a patient responsible charge related to this service. The patient expressed understanding and agreed to proceed.    Shared Decision Making Visit Lung Cancer Screening Program [phone])   Eligibility: Age 78 y.o. Pack Years Smoking History Calculation 49 (# packs/per year x # years smoked) Recent History of coughing up blood  no Unexplained weight loss? no ( >Than 15 pounds within the last 6 months ) Prior History Lung / other cancer no (Diagnosis within the last 5 years already requiring surveillance chest CT Scans). Smoking Status Former Smoker Former Smokers: Years since quit: 8 years  Quit Date: 2017  Visit Components: Discussion included one or more decision making aids. yes Discussion included risk/benefits of screening. yes Discussion included potential follow up diagnostic testing for abnormal scans. yes Discussion included meaning and risk of over diagnosis. yes Discussion included meaning and risk of False Positives. yes Discussion included meaning of total radiation exposure. yes  Counseling Included: Importance of adherence to annual lung cancer LDCT screening. yes Impact of comorbidities on ability to participate in the program. yes Ability and willingness to under diagnostic treatment. yes  Smoking Cessation Counseling: Current Smokers:  Discussed importance of smoking cessation. yes Information about tobacco cessation classes and interventions provided  to patient. yes Patient provided with ticket for LDCT Scan. no Symptomatic Patient. no   Diagnosis Code: Tobacco Use Z72.0 Asymptomatic Patient yes  Former Smokers:  Discussed the importance of maintaining cigarette abstinence. yes Diagnosis Code: Personal History of Nicotine Dependence. S12.108 Information about tobacco cessation classes and interventions provided to patient. Yes Patient provided with ticket for LDCT Scan. no Written Order for Lung Cancer Screening with LDCT placed in Epic. Yes (CT Chest Lung Cancer Screening Low Dose W/O CM) [id number] Z12.2-Screening of respiratory organs Z87.891-Personal history of nicotine dependence   Laneta Speaks, RN

## 2024-04-01 NOTE — Patient Instructions (Signed)

## 2024-04-04 ENCOUNTER — Other Ambulatory Visit: Payer: Self-pay

## 2024-04-06 ENCOUNTER — Inpatient Hospital Stay: Admission: RE | Admit: 2024-04-06 | Discharge: 2024-04-06 | Attending: Acute Care | Admitting: Acute Care

## 2024-04-06 DIAGNOSIS — Z122 Encounter for screening for malignant neoplasm of respiratory organs: Secondary | ICD-10-CM

## 2024-04-06 DIAGNOSIS — Z87891 Personal history of nicotine dependence: Secondary | ICD-10-CM

## 2024-04-06 MED ORDER — ISOSORBIDE MONONITRATE ER 30 MG PO TB24: 30.0000 mg | ORAL_TABLET | Freq: Every day | ORAL | 0 refills | Status: AC

## 2024-04-11 ENCOUNTER — Telehealth: Payer: Self-pay | Admitting: Acute Care

## 2024-04-11 DIAGNOSIS — Z87891 Personal history of nicotine dependence: Secondary | ICD-10-CM

## 2024-04-11 DIAGNOSIS — Z122 Encounter for screening for malignant neoplasm of respiratory organs: Secondary | ICD-10-CM

## 2024-04-11 NOTE — Telephone Encounter (Signed)
 Called and spoke to pt. Informed him of the results and recommendations of the LDCT. Informed him of the stable thyroid  nodule. Patient aware we will forward the results to his PCP. Called patient's PCP and informed receptionist to relay the results to PCP and can call back if needed. Results sent to PCP. Annual scan ordered.   IMPRESSION: 1. Lung-RADS 2, benign appearance or behavior. Continue annual screening with low-dose chest CT without contrast in 12 months. 2. Stable 1.7 cm posterior right thyroid  nodule. Thyroid  ultrasound correlation advised. 3. Aortic Atherosclerosis (ICD10-I70.0) and Emphysema (ICD10-J43.9).     Electronically Signed   By: Selinda DELENA Blue M.D.   On: 04/08/2024 17:47

## 2024-04-18 ENCOUNTER — Other Ambulatory Visit: Payer: Self-pay | Admitting: Internal Medicine

## 2024-04-18 DIAGNOSIS — E041 Nontoxic single thyroid nodule: Secondary | ICD-10-CM

## 2024-04-29 ENCOUNTER — Ambulatory Visit
Admission: RE | Admit: 2024-04-29 | Discharge: 2024-04-29 | Disposition: A | Source: Ambulatory Visit | Attending: Internal Medicine | Admitting: Internal Medicine

## 2024-04-29 DIAGNOSIS — E041 Nontoxic single thyroid nodule: Secondary | ICD-10-CM

## 2024-05-05 ENCOUNTER — Other Ambulatory Visit: Payer: Self-pay | Admitting: Cardiology

## 2024-05-05 DIAGNOSIS — I25118 Atherosclerotic heart disease of native coronary artery with other forms of angina pectoris: Secondary | ICD-10-CM

## 2024-05-06 NOTE — Telephone Encounter (Signed)
 In accordance with refill protocols, please review and address the following requirements before this medication refill can be authorized:  Labs

## 2024-05-06 NOTE — Telephone Encounter (Signed)
 Patient had lab work done at [facility] 03/22/24. ([id number])  Left message for patient to call back to schedule an appointment with Dr. Ladona.
